# Patient Record
Sex: Male | Born: 1955 | Race: Black or African American | Hispanic: Refuse to answer | Marital: Married | Smoking: Never smoker
Health system: Southern US, Community
[De-identification: ages and names within clinical notes are randomized; demographics above are authoritative.]

## PROBLEM LIST (undated history)

## (undated) DIAGNOSIS — Z973 Presence of spectacles and contact lenses: Secondary | ICD-10-CM

## (undated) DIAGNOSIS — M549 Dorsalgia, unspecified: Secondary | ICD-10-CM

## (undated) DIAGNOSIS — C61 Malignant neoplasm of prostate: Secondary | ICD-10-CM

## (undated) DIAGNOSIS — Z972 Presence of dental prosthetic device (complete) (partial): Secondary | ICD-10-CM

## (undated) DIAGNOSIS — I10 Essential (primary) hypertension: Secondary | ICD-10-CM

## (undated) DIAGNOSIS — R42 Dizziness and giddiness: Secondary | ICD-10-CM

## (undated) HISTORY — PX: CARPAL TUNNEL RELEASE: SHX101

## (undated) HISTORY — PX: PROSTATE BIOPSY: SHX241

## (undated) HISTORY — PX: WISDOM TOOTH EXTRACTION: SHX21

## (undated) HISTORY — PX: HIP ARTHROPLASTY: SHX981

---

## 2013-10-29 ENCOUNTER — Encounter (HOSPITAL_COMMUNITY): Payer: Self-pay | Admitting: Emergency Medicine

## 2013-10-29 ENCOUNTER — Emergency Department (INDEPENDENT_AMBULATORY_CARE_PROVIDER_SITE_OTHER)
Admission: EM | Admit: 2013-10-29 | Discharge: 2013-10-29 | Disposition: A | Discharge status: Home / Self Care | Payer: Medicare Other | Source: Home / Self Care | Attending: Family Medicine | Admitting: Family Medicine

## 2013-10-29 DIAGNOSIS — M169 Osteoarthritis of hip, unspecified: Secondary | ICD-10-CM

## 2013-10-29 DIAGNOSIS — M1610 Unilateral primary osteoarthritis, unspecified hip: Secondary | ICD-10-CM

## 2013-10-29 HISTORY — DX: Dorsalgia, unspecified: M54.9

## 2013-10-29 NOTE — ED Notes (Signed)
Patient requesting note to be excused from jury duty.  Patient reports disability issues.  Patient does not have a pcp

## 2013-10-29 NOTE — Discharge Instructions (Signed)
Thank you for coming in today. Please followup with her regular doctor to talk about your disability and your blood pressure. Please consider hip replacement. Call or go to the emergency room if you get worse, have trouble breathing, have chest pains, or palpitations.

## 2013-10-29 NOTE — ED Provider Notes (Signed)
Ellwood Steidle is a 58 y.o. male who presents to Urgent Care today for chronic hip pain. Patient has chronic hip and low back pain secondary to DJD and DDD. He would like any excuse from jury duty form filled out if possible. He also has some questions about hip replacement. He stays active and cycles most is a week and participates in martial arts. He is unable to sit for more than an hour without significant pain. Some days he has considerable difficulty walking and uses a cane. He does not take any medications for his pain. He has been on Social Security disability now for 5 years.  He notes that his blood pressures usually in the 150s. He is trying lifestyle modification. No chest pains palpitations weakness or numbness   Past Medical History  Diagnosis Date  . Back pain    History  Substance Use Topics  . Smoking status: Never Smoker   . Smokeless tobacco: Not on file  . Alcohol Use: Yes   ROS as above Medications: No current facility-administered medications for this encounter.   No current outpatient prescriptions on file.    Exam:  BP 194/104  Pulse 71  Temp(Src) 98.1 F (36.7 C) (Oral)  Resp 18  SpO2 100% Gen: Well NAD HEENT: EOMI,  MMM Lungs: Normal work of breathing. CTABL Heart: RRR no MRG Abd: NABS, Soft. NT, ND Exts: Brisk capillary refill, warm and well perfused.  Hips bilaterally:  Profound lack of rotation range of motion with pain. Flexion and extension are also decreased. Antalgic gait. Patient uses a cane.    Assessment and Plan: 58 y.o. male with bilateral severe hip DJD. Jury excuse in temporary handicap tag provided.  Refer to [place] for surgical consultation.   Encourage patient to consider taking medications for blood pressure. Followup with primary care provider  Discussed warning signs or symptoms. Please see discharge instructions. Patient expresses understanding.    Gregor Hams, MD 10/29/13 [phone]

## 2017-02-28 DIAGNOSIS — Z91148 Patient's other noncompliance with medication regimen for other reason: Secondary | ICD-10-CM | POA: Insufficient documentation

## 2017-02-28 DIAGNOSIS — I10 Essential (primary) hypertension: Secondary | ICD-10-CM | POA: Insufficient documentation

## 2017-02-28 DIAGNOSIS — Z9114 Patient's other noncompliance with medication regimen: Secondary | ICD-10-CM | POA: Insufficient documentation

## 2017-08-21 DIAGNOSIS — M25552 Pain in left hip: Secondary | ICD-10-CM | POA: Insufficient documentation

## 2017-08-21 DIAGNOSIS — M25551 Pain in right hip: Secondary | ICD-10-CM | POA: Insufficient documentation

## 2018-05-22 DIAGNOSIS — R972 Elevated prostate specific antigen [PSA]: Secondary | ICD-10-CM | POA: Insufficient documentation

## 2018-09-18 HISTORY — PX: HIP ARTHROPLASTY: SHX981

## 2018-09-18 HISTORY — PX: OTHER SURGICAL HISTORY: SHX169

## 2018-10-09 DIAGNOSIS — Z96649 Presence of unspecified artificial hip joint: Secondary | ICD-10-CM | POA: Insufficient documentation

## 2019-06-02 DIAGNOSIS — G5602 Carpal tunnel syndrome, left upper limb: Secondary | ICD-10-CM | POA: Insufficient documentation

## 2019-09-19 HISTORY — PX: CARPAL TUNNEL RELEASE: SHX101

## 2019-12-02 DIAGNOSIS — E782 Mixed hyperlipidemia: Secondary | ICD-10-CM | POA: Insufficient documentation

## 2020-06-03 DIAGNOSIS — N521 Erectile dysfunction due to diseases classified elsewhere: Secondary | ICD-10-CM | POA: Insufficient documentation

## 2020-06-03 DIAGNOSIS — R202 Paresthesia of skin: Secondary | ICD-10-CM | POA: Insufficient documentation

## 2020-06-03 DIAGNOSIS — R200 Anesthesia of skin: Secondary | ICD-10-CM | POA: Insufficient documentation

## 2021-04-15 ENCOUNTER — Other Ambulatory Visit: Payer: Self-pay | Admitting: Urology

## 2021-04-15 DIAGNOSIS — R972 Elevated prostate specific antigen [PSA]: Secondary | ICD-10-CM

## 2021-05-12 ENCOUNTER — Other Ambulatory Visit: Payer: Self-pay

## 2021-05-12 ENCOUNTER — Ambulatory Visit
Admission: RE | Admit: 2021-05-12 | Discharge: 2021-05-12 | Disposition: A | Discharge status: Home / Self Care | Payer: Medicare Other | Source: Ambulatory Visit | Attending: Urology | Admitting: Urology

## 2021-05-12 DIAGNOSIS — R972 Elevated prostate specific antigen [PSA]: Secondary | ICD-10-CM

## 2021-07-21 DIAGNOSIS — I517 Cardiomegaly: Secondary | ICD-10-CM | POA: Insufficient documentation

## 2021-08-17 ENCOUNTER — Other Ambulatory Visit (HOSPITAL_COMMUNITY): Payer: Self-pay | Admitting: Urology

## 2021-08-17 ENCOUNTER — Other Ambulatory Visit: Payer: Self-pay | Admitting: Urology

## 2021-08-17 DIAGNOSIS — R972 Elevated prostate specific antigen [PSA]: Secondary | ICD-10-CM

## 2021-08-19 NOTE — Progress Notes (Signed)
Sent message, via epic in basket, requesting orders in epic from surgeon.  

## 2021-08-23 NOTE — Progress Notes (Addendum)
COVID swab appointment: n/a  COVID Vaccine Completed: yes x5 Date COVID Vaccine completed: Has received booster: COVID vaccine manufacturer: Pfizer      Date of COVID positive in last 90 days: no  PCP - [place] Cardiologist - [facility]  Chest x-ray - n/a EKG - 07/07/21 req [facility] Stress Test - n/a ECHO - 07/19/21 Care Cardiac Cath - n/a Pacemaker/ICD device last checked: n/a Spinal Cord Stimulator: n/a  Sleep Study - n/a CPAP -   Fasting Blood Sugar - n/a Checks Blood Sugar _____ times a day  Blood Thinner Instructions: n/a Aspirin Instructions: Last Dose:  Activity level: Can go up a flight of stairs and perform activities of daily living without stopping and without symptoms of chest pain or shortness of breath.    Anesthesia review:   Patient denies shortness of breath, fever, cough and chest pain at PAT appointment   Patient verbalized understanding of instructions that were given to them at the PAT appointment. Patient was also instructed that they will need to review over the PAT instructions again at home before surgery.

## 2021-08-23 NOTE — Patient Instructions (Addendum)
DUE TO COVID-19 ONLY ONE VISITOR IS ALLOWED TO COME WITH YOU AND STAY IN THE WAITING ROOM ONLY DURING PRE OP AND PROCEDURE.   **NO VISITORS ARE ALLOWED IN THE SHORT STAY AREA OR RECOVERY ROOM!!**       Your procedure is scheduled on: 08/29/21   Report to [facility] Main Entrance    Report to admitting at 9:30 AM   Call this number if you have problems the morning of surgery [phone]   Do not eat food :After Midnight.   May have liquids until 8:40 AM day of surgery  CLEAR LIQUID DIET  Foods Allowed                                                                     Foods Excluded  Water, Black Coffee and tea (no milk or creamer)            liquids that you cannot  Plain Jell-O in any flavor  (No red)                                    see through such as: Fruit ices (not with fruit pulp)                                            milk, soups, orange juice              Iced Popsicles (No red)                                                 All solid food                                   Apple juices Sports drinks like Gatorade (No red) Lightly seasoned clear broth or consume(fat free) Sugar    Oral Hygiene is also important to reduce your risk of infection.                                    Remember - BRUSH YOUR TEETH THE MORNING OF SURGERY WITH YOUR REGULAR TOOTHPASTE   Take these medicines the morning of surgery with A SIP OF WATER: Amlodipine                               You may not have any metal on your body including jewelry, and body piercing             Do not wear lotions, powders, cologne, or deodorant              Men may shave face and neck.   Do not bring valuables to the hospital. [facility] IS NOT  RESPONSIBLE   FOR VALUABLES.    Patients discharged on the day of surgery will not be allowed to drive home.              Please read over the following fact sheets you were given: IF YOU HAVE QUESTIONS ABOUT YOUR PRE-OP INSTRUCTIONS  PLEASE CALL [facility] - Preparing for Surgery Before surgery, you can play an important role.  Because skin is not sterile, your skin needs to be as free of germs as possible.  You can reduce the number of germs on your skin by washing with CHG (chlorahexidine gluconate) soap before surgery.  CHG is an antiseptic cleaner which kills germs and bonds with the skin to continue killing germs even after washing. Please DO NOT use if you have an allergy to CHG or antibacterial soaps.  If your skin becomes reddened/irritated stop using the CHG and inform your nurse when you arrive at Short Stay. Do not shave (including legs and underarms) for at least 48 hours prior to the first CHG shower.  You may shave your face/neck.  Please follow these instructions carefully:  1.  Shower with CHG Soap the night before surgery and the  morning of surgery.  2.  If you choose to wash your hair, wash your hair first as usual with your normal  shampoo.  3.  After you shampoo, rinse your hair and body thoroughly to remove the shampoo.                             4.  Use CHG as you would any other liquid soap.  You can apply chg directly to the skin and wash.  Gently with a scrungie or clean washcloth.  5.  Apply the CHG Soap to your body ONLY FROM THE NECK DOWN.   Do   not use on face/ open                           Wound or open sores. Avoid contact with eyes, ears mouth and   genitals (private parts).                       Wash face,  Genitals (private parts) with your normal soap.             6.  Wash thoroughly, paying special attention to the area where your    surgery  will be performed.  7.  Thoroughly rinse your body with warm water from the neck down.  8.  DO NOT shower/wash with your normal soap after using and rinsing off the CHG Soap.                9.  Pat yourself dry with a clean towel.            10.  Wear clean pajamas.            11.  Place clean sheets on your bed the night of  your first shower and do not  sleep with pets. Day of Surgery : Do not apply any lotions/deodorants the morning of surgery.  Please wear clean clothes to the hospital/surgery center.  FAILURE TO FOLLOW THESE INSTRUCTIONS MAY RESULT IN THE CANCELLATION OF YOUR SURGERY  PATIENT SIGNATURE_________________________________  NURSE SIGNATURE__________________________________  ________________________________________________________________________

## 2021-08-24 ENCOUNTER — Encounter (HOSPITAL_COMMUNITY)
Admission: RE | Admit: 2021-08-24 | Discharge: 2021-08-24 | Disposition: A | Discharge status: Home / Self Care | Payer: Medicare Other | Source: Ambulatory Visit | Attending: Urology | Admitting: Urology

## 2021-08-24 ENCOUNTER — Encounter (HOSPITAL_COMMUNITY): Payer: Self-pay

## 2021-08-24 ENCOUNTER — Other Ambulatory Visit: Payer: Self-pay

## 2021-08-24 VITALS — BP 142/90 | HR 64 | Temp 98.4°F | Resp 12 | Ht 72.0 in | Wt 184.6 lb

## 2021-08-24 DIAGNOSIS — I2510 Atherosclerotic heart disease of native coronary artery without angina pectoris: Secondary | ICD-10-CM | POA: Insufficient documentation

## 2021-08-24 DIAGNOSIS — Z01812 Encounter for preprocedural laboratory examination: Secondary | ICD-10-CM | POA: Insufficient documentation

## 2021-08-24 HISTORY — DX: Essential (primary) hypertension: I10

## 2021-08-24 LAB — CBC
HCT: 43.4 % (ref 39.0–52.0)
Hemoglobin: 14.7 g/dL (ref 13.0–17.0)
MCH: 28.8 pg (ref 26.0–34.0)
MCHC: 33.9 g/dL (ref 30.0–36.0)
MCV: 85.1 fL (ref 80.0–100.0)
Platelets: 293 10*3/uL (ref 150–400)
RBC: 5.1 MIL/uL (ref 4.22–5.81)
RDW: 14.6 % (ref 11.5–15.5)
WBC: 7.4 10*3/uL (ref 4.0–10.5)
nRBC: 0 % (ref 0.0–0.2)

## 2021-08-24 LAB — BASIC METABOLIC PANEL
Anion gap: 8 (ref 5–15)
BUN: 21 mg/dL (ref 8–23)
CO2: 26 mmol/L (ref 22–32)
Calcium: 10.1 mg/dL (ref 8.9–10.3)
Chloride: 108 mmol/L (ref 98–111)
Creatinine, Ser: 1.25 mg/dL — ABNORMAL HIGH (ref 0.61–1.24)
GFR, Estimated: >60 mL/min (ref >60)
Glucose, Bld: 84 mg/dL (ref 70–99)
Potassium: 3.9 mmol/L (ref 3.5–5.1)
Sodium: 142 mmol/L (ref 135–145)

## 2021-08-29 ENCOUNTER — Encounter (HOSPITAL_COMMUNITY): Payer: Self-pay | Admitting: Urology

## 2021-08-29 ENCOUNTER — Ambulatory Visit (HOSPITAL_COMMUNITY)
Admission: RE | Admit: 2021-08-29 | Discharge: 2021-08-29 | Disposition: A | Discharge status: Home / Self Care | Payer: Medicare Other | Attending: Urology | Admitting: Urology

## 2021-08-29 ENCOUNTER — Ambulatory Visit (HOSPITAL_COMMUNITY)
Admission: RE | Admit: 2021-08-29 | Discharge: 2021-08-29 | Disposition: A | Discharge status: Home / Self Care | Payer: Medicare Other | Source: Ambulatory Visit | Attending: Urology | Admitting: Urology

## 2021-08-29 ENCOUNTER — Encounter (HOSPITAL_COMMUNITY)
Admission: RE | Disposition: A | Discharge status: Home / Self Care | Payer: Self-pay | Source: Home / Self Care | Attending: Urology

## 2021-08-29 ENCOUNTER — Ambulatory Visit (HOSPITAL_COMMUNITY): Payer: Medicare Other | Admitting: Anesthesiology

## 2021-08-29 DIAGNOSIS — I10 Essential (primary) hypertension: Secondary | ICD-10-CM | POA: Diagnosis not present

## 2021-08-29 DIAGNOSIS — C61 Malignant neoplasm of prostate: Secondary | ICD-10-CM | POA: Insufficient documentation

## 2021-08-29 DIAGNOSIS — R972 Elevated prostate specific antigen [PSA]: Secondary | ICD-10-CM

## 2021-08-29 DIAGNOSIS — N5201 Erectile dysfunction due to arterial insufficiency: Secondary | ICD-10-CM | POA: Diagnosis not present

## 2021-08-29 DIAGNOSIS — N400 Enlarged prostate without lower urinary tract symptoms: Secondary | ICD-10-CM | POA: Insufficient documentation

## 2021-08-29 HISTORY — PX: PROSTATE BIOPSY: SHX241

## 2021-08-29 SURGERY — BIOPSY, PROSTATE, RECTAL APPROACH, WITH US GUIDANCE
Anesthesia: General

## 2021-08-29 MED ORDER — PROPOFOL 10 MG/ML IV BOLUS
INTRAVENOUS | Status: AC
  Filled 2021-08-29: qty 20

## 2021-08-29 MED ORDER — PROPOFOL 10 MG/ML IV BOLUS
INTRAVENOUS | Status: DC | PRN
  Administered 2021-08-29: 200 mg via INTRAVENOUS
  Administered 2021-08-29: 50 mg via INTRAVENOUS

## 2021-08-29 MED ORDER — ONDANSETRON HCL 4 MG/2ML IJ SOLN
INTRAMUSCULAR | Status: AC
  Filled 2021-08-29: qty 2

## 2021-08-29 MED ORDER — CEFAZOLIN SODIUM-DEXTROSE 2-4 GM/100ML-% IV SOLN
2.0000 g | INTRAVENOUS | Status: AC
  Administered 2021-08-29: 2 g via INTRAVENOUS
  Filled 2021-08-29: qty 100

## 2021-08-29 MED ORDER — LACTATED RINGERS IV SOLN: INTRAVENOUS | Status: DC

## 2021-08-29 MED ORDER — OXYCODONE HCL 5 MG PO TABS: 5.0000 mg | ORAL_TABLET | Freq: Once | ORAL | Status: DC | PRN

## 2021-08-29 MED ORDER — MIDAZOLAM HCL 2 MG/2ML IJ SOLN
INTRAMUSCULAR | Status: AC
  Filled 2021-08-29: qty 2

## 2021-08-29 MED ORDER — ACETAMINOPHEN 500 MG PO TABS
1000.0000 mg | ORAL_TABLET | Freq: Once | ORAL | Status: AC
  Administered 2021-08-29: 1000 mg via ORAL
  Filled 2021-08-29: qty 2

## 2021-08-29 MED ORDER — MIDAZOLAM HCL 5 MG/5ML IJ SOLN
INTRAMUSCULAR | Status: DC | PRN
Start: 2021-08-29 — End: 2021-08-29

## 2021-08-29 MED ORDER — PHENYLEPHRINE 40 MCG/ML (10ML) SYRINGE FOR IV PUSH (FOR BLOOD PRESSURE SUPPORT)
PREFILLED_SYRINGE | INTRAVENOUS | Status: AC
  Filled 2021-08-29: qty 20

## 2021-08-29 MED ORDER — SUGAMMADEX SODIUM 500 MG/5ML IV SOLN
INTRAVENOUS | Status: AC
  Filled 2021-08-29: qty 15

## 2021-08-29 MED ORDER — ONDANSETRON HCL 4 MG/2ML IJ SOLN
INTRAMUSCULAR | Status: DC | PRN
  Administered 2021-08-29: 4 mg via INTRAVENOUS

## 2021-08-29 MED ORDER — OXYCODONE HCL 5 MG/5ML PO SOLN: 5.0000 mg | Freq: Once | ORAL | Status: DC | PRN

## 2021-08-29 MED ORDER — DEXAMETHASONE SODIUM PHOSPHATE 10 MG/ML IJ SOLN
INTRAMUSCULAR | Status: AC
  Filled 2021-08-29: qty 1

## 2021-08-29 MED ORDER — PHENYLEPHRINE 40 MCG/ML (10ML) SYRINGE FOR IV PUSH (FOR BLOOD PRESSURE SUPPORT)
PREFILLED_SYRINGE | INTRAVENOUS | Status: DC | PRN
  Administered 2021-08-29: 80 ug via INTRAVENOUS
  Administered 2021-08-29: 40 ug via INTRAVENOUS
  Administered 2021-08-29: 80 ug via INTRAVENOUS
  Administered 2021-08-29: 120 ug via INTRAVENOUS
  Administered 2021-08-29 (×3): 80 ug via INTRAVENOUS

## 2021-08-29 MED ORDER — LIDOCAINE HCL 2 % IJ SOLN
INTRAMUSCULAR | Status: AC
  Filled 2021-08-29: qty 20

## 2021-08-29 MED ORDER — LIDOCAINE 2% (20 MG/ML) 5 ML SYRINGE
INTRAMUSCULAR | Status: DC | PRN
  Administered 2021-08-29: 100 mg via INTRAVENOUS

## 2021-08-29 MED ORDER — FENTANYL CITRATE PF 50 MCG/ML IJ SOSY: 25.0000 ug | PREFILLED_SYRINGE | INTRAMUSCULAR | Status: DC | PRN

## 2021-08-29 MED ORDER — AMISULPRIDE (ANTIEMETIC) 5 MG/2ML IV SOLN: 10.0000 mg | Freq: Once | INTRAVENOUS | Status: DC | PRN

## 2021-08-29 MED ORDER — GLYCOPYRROLATE 0.2 MG/ML IJ SOLN
INTRAMUSCULAR | Status: AC
  Filled 2021-08-29: qty 1

## 2021-08-29 MED ORDER — ORAL CARE MOUTH RINSE: 15.0000 mL | Freq: Once | OROMUCOSAL | Status: AC

## 2021-08-29 MED ORDER — FENTANYL CITRATE (PF) 100 MCG/2ML IJ SOLN
INTRAMUSCULAR | Status: AC
  Filled 2021-08-29: qty 2

## 2021-08-29 MED ORDER — GENTAMICIN SULFATE 40 MG/ML IJ SOLN
5.0000 mg/kg | INTRAVENOUS | Status: AC
  Administered 2021-08-29: 420 mg via INTRAVENOUS
  Filled 2021-08-29: qty 10.5

## 2021-08-29 MED ORDER — DEXAMETHASONE SODIUM PHOSPHATE 10 MG/ML IJ SOLN
INTRAMUSCULAR | Status: DC | PRN
  Administered 2021-08-29: 10 mg via INTRAVENOUS

## 2021-08-29 MED ORDER — CHLORHEXIDINE GLUCONATE 0.12 % MT SOLN
15.0000 mL | Freq: Once | OROMUCOSAL | Status: AC
  Administered 2021-08-29: 15 mL via OROMUCOSAL

## 2021-08-29 MED ORDER — ONDANSETRON HCL 4 MG/2ML IJ SOLN: 4.0000 mg | Freq: Once | INTRAMUSCULAR | Status: DC | PRN

## 2021-08-29 MED ORDER — FENTANYL CITRATE (PF) 100 MCG/2ML IJ SOLN
INTRAMUSCULAR | Status: DC | PRN
  Administered 2021-08-29: 50 ug via INTRAVENOUS

## 2021-08-29 SURGICAL SUPPLY — 11 items
BAG COUNTER SPONGE SURGICOUNT (BAG) IMPLANT
COVER SURGICAL LIGHT HANDLE (MISCELLANEOUS) ×1 IMPLANT
DRSG TEGADERM 4X4.75 (GAUZE/BANDAGES/DRESSINGS) ×1 IMPLANT
DRSG TELFA 3X8 NADH (GAUZE/BANDAGES/DRESSINGS) ×2 IMPLANT
GAUZE SPONGE 4X4 12PLY STRL (GAUZE/BANDAGES/DRESSINGS) ×1 IMPLANT
INST BIOPSY MAXCORE 18GX25 (NEEDLE) ×1 IMPLANT
INSTR BIOPSY MAXCORE 18GX20 (NEEDLE) IMPLANT
PAD DRESSING TELFA 3X8 NADH (GAUZE/BANDAGES/DRESSINGS) IMPLANT
PENCIL SMOKE EVACUATOR (MISCELLANEOUS) IMPLANT
SYR CONTROL 10ML LL (SYRINGE) IMPLANT
UNDERPAD 30X36 HEAVY ABSORB (UNDERPADS AND DIAPERS) ×2 IMPLANT

## 2021-08-29 NOTE — Transfer of Care (Signed)
Immediate Anesthesia Transfer of Care Note  Patient: Ethan Morton  Procedure(s) Performed: Procedure(s): BIOPSY TRANSRECTAL ULTRASONIC PROSTATE (TUBP) (N/A)  Patient Location: PACU  Anesthesia Type:General  Level of Consciousness: Alert, Awake, Oriented  Airway & Oxygen Therapy: Patient Spontanous Breathing  Post-op Assessment: Report given to RN  Post vital signs: Reviewed and stable  Last Vitals:  Vitals:   08/29/21 0940  BP: 128/85  Pulse: (!) 51  Resp: 15  Temp: 36.8 C  SpO2: 409%    Complications: No apparent anesthesia complications

## 2021-08-29 NOTE — Anesthesia Preprocedure Evaluation (Addendum)
Anesthesia Evaluation  Patient identified by MRN, date of birth, ID band Patient awake    Reviewed: Allergy & Precautions, NPO status , Patient's Chart, lab work & pertinent test results  Airway Mallampati: III  TM Distance: >3 FB Neck ROM: Full    Dental  (+) Edentulous Lower, Edentulous Upper,  2 teeth present, loose:   Pulmonary neg pulmonary ROS,    Pulmonary exam normal breath sounds clear to auscultation       Cardiovascular Exercise Tolerance: Good hypertension, Pt. on medications Normal cardiovascular exam Rhythm:Regular Rate:Normal     Neuro/Psych negative neurological ROS  negative psych ROS   GI/Hepatic negative GI ROS, (+)     substance abuse  marijuana use,   Endo/Other  negative endocrine ROS  Renal/GU negative Renal ROS  negative genitourinary   Musculoskeletal negative musculoskeletal ROS (+)   Abdominal   Peds negative pediatric ROS (+)  Hematology negative hematology ROS (+)   Anesthesia Other Findings   Reproductive/Obstetrics negative OB ROS                           Anesthesia Physical Anesthesia Plan  ASA: 2  Anesthesia Plan: General   Post-op Pain Management:    Induction:   PONV Risk Score and Plan: 2 and Treatment may vary due to age or medical condition  Airway Management Planned: LMA  Additional Equipment: None  Intra-op Plan:   Post-operative Plan: Extubation in OR  Informed Consent: I have reviewed the patients History and Physical, chart, labs and discussed the procedure including the risks, benefits and alternatives for the proposed anesthesia with the patient or authorized representative who has indicated his/her understanding and acceptance.     Dental advisory given  Plan Discussed with: CRNA, Surgeon and Anesthesiologist  Anesthesia Plan Comments:        Anesthesia Quick Evaluation

## 2021-08-29 NOTE — Anesthesia Procedure Notes (Signed)
Procedure Name: LMA Insertion Date/Time: 08/29/2021 12:51 PM Performed by: Gerald Leitz, CRNA Pre-anesthesia Checklist: Patient identified, Patient being monitored, Timeout performed, Emergency Drugs available and Suction available Patient Re-evaluated:Patient Re-evaluated prior to induction Oxygen Delivery Method: Circle system utilized Preoxygenation: Pre-oxygenation with 100% oxygen Induction Type: IV induction Ventilation: Mask ventilation without difficulty LMA: LMA inserted LMA Size: 4.0 Tube type: Oral Number of attempts: 1 Placement Confirmation: positive ETCO2 and breath sounds checked- equal and bilateral Tube secured with: Tape Dental Injury: Teeth and Oropharynx as per pre-operative assessment  Comments: Dentition noted in pre-assessment; Bottom left lower tooth loose and bleed upon LMA insertion-remains intact. MDA Bass at bedside to assess. Patient coughing against LMA after placement; deepened anesthetic and patient VSS; Proceeding with case.

## 2021-08-29 NOTE — Anesthesia Postprocedure Evaluation (Signed)
Anesthesia Post Note  Patient: Dmarion Perfect  Procedure(s) Performed: BIOPSY TRANSRECTAL ULTRASONIC PROSTATE (TUBP)     Patient location during evaluation: PACU Anesthesia Type: General Level of consciousness: awake Pain management: pain level controlled Vital Signs Assessment: post-procedure vital signs reviewed and stable Respiratory status: spontaneous breathing and respiratory function stable Cardiovascular status: stable Postop Assessment: no apparent nausea or vomiting Anesthetic complications: no   No notable events documented.  Last Vitals:  Vitals:   08/29/21 1345 08/29/21 1400  BP: (!) 143/96 (!) 150/81  Pulse: 67 63  Resp: 17 20  Temp: 36.7 C 36.7 C  SpO2: 98% 100%    Last Pain:  Vitals:   08/29/21 1400  TempSrc: Oral  PainSc: 0-No pain                 Merlinda Frederick

## 2021-08-29 NOTE — H&P (Signed)
CC: AUA Questions Scoring.  HPI: Ethan Morton is a 65 year-old male established patient who is here AUA Questions.      CC/HPI: CC: Elevated PSA, history of atypical glands.  HPI:  10/22/2019  65 year old male comes in for a 2nd opinion. Prior urologist was Ihor Dow. Patient has a history of elevated PSA. PSA values are as follows   04/01/2020: 5.8  07/31/2018: 4.7  06/02/1999 20:5.5   Patient underwent an initial prostate biopsy on 05/14/2018. This revealed a small focus of atypical glands. For this reason, he underwent a repeat biopsy on 07/31/2018. This again revealed a small focus of atypical glands. He had a 46 g prostate. He subsequently underwent an MRI of the prostate on 09/22/2019. This revealed a PIRADs 3 lesion in the right posterior peripheral zone. Prostate size was 39 mL. It was a 1.3 cm lesion. He did have a great deal pain with 1st 2 biopsies.   01/13/2020  Patient was recently scheduled for repeat MRI/ultrasound fusion guided biopsy. Our radiologist here as a 2nd opinion read the MRI and felt like his lesion represented a PIRADs 2 lesion felt to represent a benign lesion. Therefore, after discussion with the patient, he elected to undergo observation. Most recent PSA was up slightly and is now 6.54.   09/30/2020  Most recent PSA on 06/03/2020 was 6.9. This is relatively stable for him. He does not want to recheck it at this time. He is taking an over-the-counter supplement called noxitril for erectile dysfunction. I am not familiar with that. He was taking sildenafil, up to 100 mg, but he was not getting quite where he would like. No voiding complaints.   03/31/2021  Patient has not had an updated PSA. He has no complaints. He continues on Cialis and does well with this in regards to erectile dysfunction.   08/02/2021  Most recent PSA on 07/07/2021 was 10.1. He underwent MRI of the prostate that revealed a 61.5 g prostate. He was found to have a PI-RADS 3 lesion in  the right posterior peripheral zone centered in the mid gland extending towards the base. He states he is unable to undergo a clinic biopsy and request operating room. He understands that MRI fusion cannot be done in the operating room since they do not have the technology. I offered an MRI fusion biopsy in the office and he declined. He is also very interested in an inflatable penile prosthesis. He continues to use Cialis with mixed results. He is scared of needles and does not want to perform intracorporeal injection.     AUA Symptom Score: He never has the sensation of not emptying his bladder completely after finishing urinating. He never has to urinate again less that two hours after he has finished urinating. He does not have to stop and start again several times when he urinates. He never finds it difficult to postpone urination. He never has a weak urinary stream. He never has to push or strain to begin urination. He has to get up to urinate 1 time from the time he goes to bed until the time he gets up in the morning.   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 Has not smoked since 10/20/1979.   Tobacco Use Assessment Completed: Used Tobacco in last 30 days? Does not drink caffeine.    REVIEW  OF SYSTEMS:    GU Review Male:   Patient denies frequent urination, hard to postpone urination, burning/ pain with urination, get up at night to urinate, leakage of urine, stream starts and stops, trouble starting your stream, have to strain to urinate , erection problems, and penile pain.  Gastrointestinal (Upper):   Patient denies nausea, vomiting, and indigestion/ heartburn.  Gastrointestinal (Lower):   Patient denies diarrhea and constipation.  Constitutional:   Patient denies fever, night sweats, weight loss, and fatigue.  Skin:   Patient denies skin rash/ lesion and itching.  Eyes:   Patient denies blurred vision and double vision.  Ears/ Nose/ Throat:   Patient denies sore throat and sinus problems.  Hematologic/Lymphatic:   Patient denies swollen glands and easy bruising.  Cardiovascular:   Patient denies leg swelling and chest pains.  Respiratory:   Patient denies cough and shortness of breath.  Endocrine:   Patient denies excessive thirst.  Musculoskeletal:   Patient denies back pain and joint pain.  Neurological:   Patient denies headaches and dizziness.  Psychologic:   Patient denies depression and anxiety.   VITAL SIGNS: None   GU PHYSICAL EXAMINATION:    Testes: No tenderness, no swelling, no enlargement left testes. No tenderness, no swelling, no enlargement right testes. Normal location left testes. Normal location right testes. No mass, no cyst, no varicocele, no hydrocele left testes. No mass, no cyst, no varicocele, no hydrocele right testes.  Penis: Circumcised, no warts, no cracks. No dorsal Peyronie's plaques, no left corporal Peyronie's plaques, no right corporal Peyronie's plaques, no scarring, no warts. No balanitis, no meatal stenosis.   MULTI-SYSTEM PHYSICAL EXAMINATION:       Complexity of Data:  Source Of History:  Patient  Lab Test Review:   PSA  Records Review:   AUA Symptom Score, Previous Patient Records  Urine Test Review:   Urinalysis  X-Ray Review:  MRI Prostate GSORAD: Reviewed Films. Discussed With Patient. Performed at [facility]. 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 Risk and benefits discussed. I did offer him MRI fusion biopsy in the office but he declined and states he wants to have anesthesia for the procedure. In that case, I can perform extra biopsies in the targeted area of of the lesion.   The patient has elected to undergo placement of a three-piece inflatable penile prosthesis. He understands that this is an invasive surgical option for erectile dysfunction and will result in permanent inability to achieve natural erections. He understands the potential complications of the surgery including bleeding, pain, ST deformity, injury to surrounding structures such as urethral injury, erosion of the device, as well as the potentially devastating complication of infection that would require complete removal of the entire prosthesis. He also understands that the typical lifespan of a prosthetic is 5-10 years (but may last shorter or longer) and if it becomes nonfunctional then it will require replacement. He understands that the length of his erection will be approximately equal to  stretched penile length and that the most common complaint among men after this surgery is the appearance of a smaller erection. We discussed that there is an overall high satisfaction rate upwards of 80-90% among men who undergo the surgery. He is eager to proceed. He wants to proceed with prostate biopsy first and to give this priority.   CC: Dr. Daria Pastures      Signed by Link Snuffer, III, M.D. on 08/02/21 at 9:50 AM (EST

## 2021-08-29 NOTE — Op Note (Signed)
Operative Note  Preoperative diagnosis:  1.  Elevated PSA  Postoperative diagnosis: 1.  Elevated PSA  Procedure(s): 1.  Transrectal ultrasound-guided biopsy  Surgeon: Link Snuffer, MD  Assistants: None  Anesthesia: General  Complications: None immediate  EBL: Minimal  Specimens: 1.  4 biopsies from the area of the targeted lesion on the right mid to base peripheral prostate 2.  Right base lateral 3.  Right base medial 4.  Right mid lateral 5.  Right mid medial 6.  Right apex lateral 7.  Right apex medial 8.  Left base lateral 9.  Left base medial 10.  Left mid lateral 11.  Left mid medial 12.  Left apex lateral 13.  Left apex medial  Drains/Catheters: 1.  None  Intraoperative findings: There were no hypoechoic areas.  Prostate size was 36 g  Indication: 65 year old male with elevated PSA.  He underwent an MRI of the prostate that revealed a PI-RADS 3 lesion in the right peripheral mid prostate extending towards the base.  He declined targeted biopsy in the office with fusion software.  He elected to proceed with prostate biopsy in the operating room  Description of procedure:  The patient was identified and consent was obtained.  The patient was taken to the operating room and placed in the supine position.  The patient was placed under general anesthesia.  Perioperative antibiotics were administered.  The patient was placed in left lateral position.  Patient was prepped and draped in a standard sterile fashion and a timeout was performed.  Transrectal ultrasound was inserted into the rectum.  Ultrasound images were obtained.  Sequential biopsies were then performed collecting the specimens as above.  This concluded the operation.  Patient tolerated the procedure well was stable postoperative.  Plan: Follow-up in about 1 week for pathology review

## 2021-08-30 ENCOUNTER — Encounter (HOSPITAL_COMMUNITY): Payer: Self-pay | Admitting: Urology

## 2021-08-30 LAB — SURGICAL PATHOLOGY

## 2021-09-14 ENCOUNTER — Telehealth: Payer: Self-pay | Admitting: Radiation Oncology

## 2021-09-30 ENCOUNTER — Other Ambulatory Visit (HOSPITAL_COMMUNITY): Payer: Self-pay | Admitting: Urology

## 2021-09-30 DIAGNOSIS — C61 Malignant neoplasm of prostate: Secondary | ICD-10-CM

## 2021-10-05 ENCOUNTER — Encounter (HOSPITAL_COMMUNITY)
Admission: RE | Admit: 2021-10-05 | Discharge: 2021-10-05 | Disposition: A | Discharge status: Home / Self Care | Payer: Medicare Other | Source: Ambulatory Visit | Attending: Urology | Admitting: Urology

## 2021-10-05 DIAGNOSIS — C61 Malignant neoplasm of prostate: Secondary | ICD-10-CM | POA: Insufficient documentation

## 2021-10-12 NOTE — Progress Notes (Signed)
GU Location of Tumor / Histology: Prostate Ca  If Prostate Cancer, Gleason Score is (4 + 3) and PSA is (10.1)  Biopsies  Dr. Gloriann Loan   Past/Anticipated interventions by urology, if any:   Weight changes, if any: No  IPSS:  6 SHIM: 16  Bowel/Bladder complaints, if any:  No bowel or bladder issues patient seems to think BP meds is cauing urinary issues frequency.  Nausea/Vomiting, if any:  No  Pain issues, if any:  0/10  SAFETY ISSUES: Prior radiation?  No Pacemaker/ICD? No Possible current pregnancy?  Male Is the patient on methotrexate? No  Current Complaints / other details:  Need more information on treatment options.

## 2021-10-14 ENCOUNTER — Ambulatory Visit (HOSPITAL_COMMUNITY)
Admission: RE | Admit: 2021-10-14 | Discharge: 2021-10-14 | Disposition: A | Discharge status: Home / Self Care | Payer: Medicare Other | Source: Ambulatory Visit | Attending: Urology | Admitting: Urology

## 2021-10-14 ENCOUNTER — Other Ambulatory Visit: Payer: Self-pay

## 2021-10-14 DIAGNOSIS — C61 Malignant neoplasm of prostate: Secondary | ICD-10-CM | POA: Diagnosis not present

## 2021-10-14 MED ORDER — PIFLIFOLASTAT F 18 (PYLARIFY) INJECTION
9.0000 | Freq: Once | INTRAVENOUS | Status: AC
  Administered 2021-10-14: 8.8 via INTRAVENOUS

## 2021-10-18 ENCOUNTER — Ambulatory Visit
Admission: RE | Admit: 2021-10-18 | Discharge: 2021-10-18 | Disposition: A | Discharge status: Home / Self Care | Payer: Medicare Other | Source: Ambulatory Visit | Attending: Radiation Oncology | Admitting: Radiation Oncology

## 2021-10-18 ENCOUNTER — Other Ambulatory Visit: Payer: Self-pay

## 2021-10-18 VITALS — BP 155/84 | HR 55 | Temp 96.4°F | Resp 18 | Ht 72.0 in | Wt 197.0 lb

## 2021-10-18 DIAGNOSIS — C61 Malignant neoplasm of prostate: Secondary | ICD-10-CM

## 2021-10-18 DIAGNOSIS — Z79899 Other long term (current) drug therapy: Secondary | ICD-10-CM | POA: Diagnosis not present

## 2021-10-18 DIAGNOSIS — M545 Low back pain, unspecified: Secondary | ICD-10-CM | POA: Diagnosis not present

## 2021-10-18 DIAGNOSIS — I10 Essential (primary) hypertension: Secondary | ICD-10-CM | POA: Insufficient documentation

## 2021-10-18 NOTE — Progress Notes (Signed)
Introduced myself to the patient, and his wife, as the prostate nurse navigator.  Patient was accepting assistance with transportation for upcoming radiation appointment, and I educated patient that we can provide and coordinate this once appointments are scheduled.  He is here to discuss his radiation treatment options.  I gave him my business card and asked him to call me with questions or concerns.  Verbalized understanding.  RN will continue to follow to ensure transportation needs are met.

## 2021-10-18 NOTE — Progress Notes (Signed)
Radiation Oncology         [phone]) [phone] ________________________________  Initial Outpatient Consultation  Name: Ethan Morton MRN: [id number]  Date: 10/18/2021  DOB: 14-Feb-1956  NG:[id number], Cherylann Ratel, PA-C  Lucas Mallow, MD   REFERRING PHYSICIAN: Lucas Mallow, MD  DIAGNOSIS: 66 y.o. gentleman with Stage T1c adenocarcinoma of the prostate with Gleason score of 4+3, and PSA of 10.1.    ICD-10-CM   1. Malignant neoplasm of prostate ([place])  C61       HISTORY OF PRESENT ILLNESS: Ethan Morton is a 66 y.o. male with a diagnosis of prostate cancer. He has been followed by urology for an elevated PSA since at least 2019, previously seen by Dr. Tommi Rumps. His PSA was 4.7 and prior biopsies on 05/14/18 and 07/31/18 showed only a small focus of atypical glands. Because of the pain he experienced with the first two biopsies, he has been reluctant to proceed with further biopsy but has continued to monitor the PSA closely. He has been followed by Dr. Gloriann Loan since 10/2019 and PSA has fluctuated. His PSA was 5.5 in 05/2019 so he had a prostate MRI at [facility] in 09/2019 that showed a PI-RADS 3 lesion, but the lesion was felt to be only category 2 on a second reading by [facility]. Thus, MRI fusion prostate biopsy was cancelled and the decision was to continue to monitor the PSA. The PSA was 6.54 in 12/2019, 5.8 in 03/2020 and 6.9 in 05/2020.  The PSA increased to 8.08 in 03/2021 and up to 10.1  on 07/07/2021. This prompted a repeat prostate MRI at [facility] on 07/26/21 showing no significant change in the PI-RADS 3 lesion in the right peripheral zone. He was referred back to Dr. Gloriann Loan for consideration of repeat biopsy. The patient declined in-office MRI fusion biopsy and preferred to proceed with transrectal ultrasound with 12 biopsies of the prostate in the OR under anesthesia on 08/29/21.  The prostate volume measured 36 cc.  Out of 16 core biopsies, 5 were positive.  The maximum Gleason score was 4+3,  and this was seen in the left base lateral (with perineural invasion). Additionally, Gleason 3+4 was seen in the left apex and one core from right mid peripheral (with PNI), and small foci of Gleason 3+3 in a second right mid peripheral core and the left mid.  A PSMA scan was performed on 10/14/21 for disease staging and showed significant streak artifact in pelvis and attenuation of radiotracer activity related to bilateral hip prosthetics but within this caveat, no focal activity in  the prostate and no suspicious lymph nodes or visceral or skeletal metastasis.  The patient reviewed the biopsy results with his urologist and he has kindly been referred today for discussion of potential radiation treatment options.   PREVIOUS RADIATION THERAPY: No  PAST MEDICAL HISTORY:  Past Medical History:  Diagnosis Date   Back pain    Hypertension       PAST SURGICAL HISTORY: Past Surgical History:  Procedure Laterality Date   CARPAL TUNNEL RELEASE Left    HIP ARTHROPLASTY Bilateral    PROSTATE BIOPSY     PROSTATE BIOPSY N/A 08/29/2021   Procedure: BIOPSY TRANSRECTAL ULTRASONIC PROSTATE (TUBP);  Surgeon: Lucas Mallow, MD;  Location: [facility];  Service: Urology;  Laterality: N/A;   WISDOM TOOTH EXTRACTION      FAMILY HISTORY: No family history on file.  SOCIAL HISTORY:  Social History   Socioeconomic History   Marital  status: Married    Spouse name: Not on file   Number of children: Not on file   Years of education: Not on file   Highest education level: Not on file  Occupational History   Not on file  Tobacco Use   Smoking status: Never   Smokeless tobacco: Not on file  Vaping Use   Vaping Use: Never used  Substance and Sexual Activity   Alcohol use: Yes    Comment: once a month   Drug use: Yes    Frequency: 5.0 times per week    Types: Marijuana   Sexual activity: Not on file  Other Topics Concern   Not on file  Social History Narrative   Not on file   Social  Determinants of Health   Financial Resource Strain: Not on file  Food Insecurity: Not on file  Transportation Needs: Not on file  Physical Activity: Not on file  Stress: Not on file  Social Connections: Not on file  Intimate Partner Violence: Not on file    ALLERGIES: Patient has no known allergies.  MEDICATIONS:  Current Outpatient Medications  Medication Sig Dispense Refill   amLODipine (NORVASC) 10 MG tablet Take 10 mg by mouth daily.     Ascorbic Acid (VITAMIN C) 1000 MG tablet Take 1,000 mg by mouth daily.     Cholecalciferol (VITAMIN D-3) 125 MCG (5000 UT) TABS Take 5,000 Units by mouth daily.     losartan (COZAAR) 50 MG tablet Take 50 mg by mouth daily.     niacin 500 MG tablet Take 500 mg by mouth at bedtime.     OLIVE LEAF PO Take 750 mg by mouth daily.     Omega-3 Fatty Acids (FISH OIL PO) Take 840 mg by mouth daily.     tadalafil (CIALIS) 20 MG tablet Take 10 mg by mouth daily as needed for erectile dysfunction.     vitamin B-12 (CYANOCOBALAMIN) 1000 MCG tablet Take 1,000 mcg by mouth daily.     No current facility-administered medications for this encounter.    REVIEW OF SYSTEMS:  On review of systems, the patient reports that he is doing well overall. He denies any chest pain, shortness of breath, cough, fevers, chills, night sweats, unintended weight changes. He denies any bowel disturbances, and denies abdominal pain, nausea or vomiting. He denies any new musculoskeletal or joint aches or pains. His IPSS was 6, indicating mild urinary symptoms. His SHIM was 16, indicating he has moderate erectile dysfunction. A complete review of systems is obtained and is otherwise negative.    PHYSICAL EXAM:  Wt Readings from Last 3 Encounters:  10/18/21 197 lb (89.4 kg)  08/24/21 184 lb 9.6 oz (83.7 kg)   Temp Readings from Last 3 Encounters:  10/18/21 (!) 96.4 F (35.8 C) (Temporal)  08/29/21 98 F (36.7 C) (Oral)  08/24/21 98.4 F (36.9 C) (Oral)   BP Readings from  Last 3 Encounters:  10/18/21 (!) 155/84  08/29/21 (!) 150/81  08/24/21 (!) 142/90   Pulse Readings from Last 3 Encounters:  10/18/21 (!) 55  08/29/21 63  08/24/21 64   Pain Assessment Pain Score: 0-No pain/10  In general this is a well appearing African American male in no acute distress. He's alert and oriented x4 and appropriate throughout the examination. Cardiopulmonary assessment is negative for acute distress, and he exhibits normal effort.     KPS = 100  100 - Normal; no complaints; no evidence of disease. 90   - Able  to carry on normal activity; minor signs or symptoms of disease. 80   - Normal activity with effort; some signs or symptoms of disease. 60   - Cares for self; unable to carry on normal activity or to do active work. 60   - Requires occasional assistance, but is able to care for most of his personal needs. 50   - Requires considerable assistance and frequent medical care. 44   - Disabled; requires special care and assistance. 74   - Severely disabled; hospital admission is indicated although death not imminent. 11   - Very sick; hospital admission necessary; active supportive treatment necessary. 10   - Moribund; fatal processes progressing rapidly. 0     - Dead  Karnofsky DA, Abelmann Woodruff, Craver LS and Burchenal [facility] [phone]) The use of the nitrogen mustards in the palliative treatment of carcinoma: with particular reference to bronchogenic carcinoma Cancer 1 634-56  LABORATORY DATA:  Lab Results  Component Value Date   WBC 7.4 08/24/2021   HGB 14.7 08/24/2021   HCT 43.4 08/24/2021   MCV 85.1 08/24/2021   PLT 293 08/24/2021   Lab Results  Component Value Date   NA 142 08/24/2021   K 3.9 08/24/2021   CL 108 08/24/2021   CO2 26 08/24/2021   No results found for: ALT, AST, GGT, ALKPHOS, BILITOT   RADIOGRAPHY: NM PET (PSMA) SKULL TO MID THIGH  Result Date: 10/16/2021 CLINICAL DATA:  66 year old male with prostate carcinoma. Biopsy 08/29/2021. PSA 10.1.  prostate biopsy field Gleason 3+4=7 adenocarcinoma in the RIGHT mid gland, LEFT base, and LEFT apex. EXAM: NUCLEAR MEDICINE PET SKULL BASE TO THIGH TECHNIQUE: 10.1 mCi F18 Piflufolastat (Pylarify) was injected intravenously. Full-ring PET imaging was performed from the skull base to thigh after the radiotracer. CT data was obtained and used for attenuation correction and anatomic localization. COMPARISON:  None. FINDINGS: NECK No radiotracer activity in neck lymph nodes. Incidental CT finding: None CHEST No radiotracer accumulation within mediastinal or hilar lymph nodes. No suspicious pulmonary nodules on the CT scan. Incidental CT finding: None ABDOMEN/PELVIS Prostate: Significant streak artifact and radiotracer attenuation through the lower pelvis generated by the bilateral hip prosthetics. No focal activity in the prostate bed. Lymph nodes: No abnormal radiotracer accumulation within pelvic or abdominal nodes. There is significant streak artifact and attenuation of radiotracer activity within the pelvis. Liver: No evidence of liver metastasis Incidental CT finding: None SKELETON No focal  activity to suggest skeletal metastasis. IMPRESSION: 1. Significant streak artifact in the pelvis and attenuation of radiotracer activity related to the bilateral hip prosthetics. With this caveat, there is no focal activity in the prostate bed. No suspicious lymph nodes in the pelvis. 2. No periaortic retroperitoneal lymphadenopathy. 3. No visceral metastasis or skeletal metastasis. Electronically Signed   By: Suzy Bouchard M.D.   On: 10/16/2021 09:17      IMPRESSION/PLAN: 1. 66 y.o. gentleman with Stage T1c adenocarcinoma of the prostate with Gleason Score of 4+3, and PSA of 10.1. We discussed the patient's workup and outlined the nature of prostate cancer in this setting. The patient's T stage, Gleason's score, and PSA put him into the unfavorable intermediate risk group. Accordingly, he is eligible for a variety of  potential treatment options including brachytherapy, 5.5 weeks of external radiation, or prostatectomy. We discussed the available radiation techniques, and focused on the details and logistics of delivery. We discussed and outlined the risks, benefits, short and long-term effects associated with radiotherapy and compared and contrasted these with  prostatectomy. We discussed the role of SpaceOAR gel in reducing the rectal toxicity associated with radiotherapy. He was encouraged to ask questions that were answered to his stated satisfaction.  At the conclusion of our conversation, the patient is interested in moving forward with 5.5 weeks of external beam therapy. We will share our discussion with Dr. Gloriann Loan and make arrangements for fiducial markers and SpaceOAR gel placement, prior to CT simulation, to reduce rectal toxicity from radiotherapy. The patient appears to have a good understanding of his disease and our treatment recommendations which are of curative intent and is in agreement with the stated plan.  Therefore, we will move forward with treatment planning accordingly, in anticipation of beginning IMRT in the near future.  We personally spent 70 minutes in this encounter including chart review, reviewing radiological studies, meeting face-to-face with the patient, entering orders and completing documentation.    [name], PA-C    Tyler Pita, MD  [facility] Direct Dial: [phone]   Fax: [phone] [email or web address]   Skype   LinkedIn   This document serves as a record of services personally performed by Tyler Pita, MD and Freeman Caldron, PA-C. It was created on their behalf by Wilburn Mylar, a trained medical scribe. The creation of this record is based on the scribe's personal observations and the provider's statements to them. This document has been checked and approved by the attending provider.

## 2021-10-25 ENCOUNTER — Other Ambulatory Visit: Payer: Self-pay | Admitting: Urology

## 2021-10-31 ENCOUNTER — Telehealth: Payer: Self-pay | Admitting: *Deleted

## 2021-10-31 NOTE — Telephone Encounter (Signed)
CALLED PATIENT TO INFORM OF SIM DATE, LVM FOR A RETURN CALL

## 2021-11-04 NOTE — Progress Notes (Signed)
Reviewed pt medical history and each results from 07-19-2021 and [place] bilateral carotid 10-07-2021 and cardiology lov dr m badal with dr w fitzgerald mda and pt meets [facility] guidelines barring any acute status changes per dr w fitzgerald mda.

## 2021-12-06 ENCOUNTER — Encounter (HOSPITAL_BASED_OUTPATIENT_CLINIC_OR_DEPARTMENT_OTHER): Payer: Self-pay | Admitting: Urology

## 2021-12-07 ENCOUNTER — Other Ambulatory Visit: Payer: Self-pay

## 2021-12-07 ENCOUNTER — Encounter (HOSPITAL_BASED_OUTPATIENT_CLINIC_OR_DEPARTMENT_OTHER): Payer: Self-pay | Admitting: Urology

## 2021-12-07 NOTE — Anesthesia Preprocedure Evaluation (Addendum)
Anesthesia Evaluation  ?Patient identified by MRN, date of birth, ID band ?Patient awake ? ? ? ?Reviewed: ?Allergy & Precautions, NPO status , Patient's Chart, lab work & pertinent test results ? ?Airway ?Mallampati: II ? ?TM Distance: >3 FB ?Neck ROM: Full ? ? ? Dental ?no notable dental hx. ?(+) Upper Dentures, Lower Dentures,  ?  ?Pulmonary ? ?  ?Pulmonary exam normal ?breath sounds clear to auscultation ? ? ? ? ? ? Cardiovascular ?hypertension, Pt. on medications ?Normal cardiovascular exam ?Rhythm:Regular Rate:Normal ? ?12/22 EKG SB w LVH ?  ?Neuro/Psych ?  ? GI/Hepatic ?  ?Endo/Other  ?negative endocrine ROS ? Renal/GU ?Lab Results ?     Component                Value               Date                 ?     CREATININE               1.20                12/08/2021           ?     K                        3.7                 12/08/2021           ?      ? ?Prostate CA ? ?  ?Musculoskeletal ? ? Abdominal ?  ?Peds ? Hematology ?Lab Results ?     Component                Value               Date                     ?     HGB                      13.9                12/08/2021           ?     HCT                      41.0                12/08/2021           ?   ?   ?Anesthesia Other Findings ?NKA ? Reproductive/Obstetrics ? ?  ? ? ? ? ? ? ? ? ? ? ? ? ? ?  ?  ? ? ? ? ? ? ? ?Anesthesia Physical ?Anesthesia Plan ? ?ASA: 3 ? ?Anesthesia Plan: MAC  ? ?Post-op Pain Management:   ? ?Induction:  ? ?PONV Risk Score and Plan: Treatment may vary due to age or medical condition ? ?Airway Management Planned: Natural Airway and Nasal Cannula ? ?Additional Equipment: None ? ?Intra-op Plan:  ? ?Post-operative Plan:  ? ?Informed Consent: I have reviewed the patients History and Physical, chart, labs and discussed the procedure including the risks, benefits and alternatives for the proposed anesthesia with the patient or authorized representative who has indicated his/her understanding and acceptance.   ? ? ? ?Dental advisory  given ? ?Plan Discussed with: CRNA and Anesthesiologist ? ?Anesthesia Plan Comments:   ? ? ? ? ? ?Anesthesia Quick Evaluation ? ?

## 2021-12-07 NOTE — Progress Notes (Addendum)
Spoke w/ via phone for pre-op interview---pt ?Lab needs dos----Istat               ?Lab results------ekg 08-29-2021 chart/epic, cardiology lov dr Junius Roads badal 09-29-2021 chart/epic, echo 07-19-2021 [facility] chart/care everywhere, US carotid bilateral 11-03-2021 chart/care everywhere ?COVID test -----patient states asymptomatic no test needed ?Arrive at -------830 am 12-08-2021 ?NPO after MN NO Solid Food.  Clear liquids from MN until---730 am ?Med rec completed ?Medications to take morning of surgery -----Amlodipine ?Diabetic medication -----n/a ?Patient instructed no nail polish to be worn day of surgery ?Patient instructed to bring photo id and insurance card day of surgery ?Patient aware to have Driver (ride ) / caregiver  wife joy wife will stay  for 24 hours after surgery  ?Patient Special Instructions -----fleets enema hs night before surgery ?Pre-Op special Istructions -----none ?Patient verbalized understanding of instructions that were given at this phone interview. ?Patient denies shortness of breath, chest pain, fever, cough at this phone interview.  ?

## 2021-12-07 NOTE — H&P (Signed)
H&P ? ?Chief Complaint: Prostate cancer ? ?History of Present Illness: 66 yo male w/ GG 3 PCa presents for placement of fiducial markers as well as SpaceOAR in advance of EBRT. ? ?Past Medical History:  ?Diagnosis Date  ? Dizzy spells   ? worked up by cardiology 09-30-2021 echo done ef 50 to 55 %  ? Hypertension   ? Prostate cancer ([place])   ? Wears dentures upper   ? Wears glasses   ? Wears partial denture lower   ? ? ?Past Surgical History:  ?Procedure Laterality Date  ? CARPAL TUNNEL RELEASE Left 2021  ? colonscopy  2020  ? HIP ARTHROPLASTY Bilateral 2020  ? PROSTATE BIOPSY    ? PROSTATE BIOPSY N/A 08/29/2021  ? Procedure: BIOPSY TRANSRECTAL ULTRASONIC PROSTATE (TUBP);  Surgeon: Lucas Mallow, MD;  Location: [facility];  Service: Urology;  Laterality: N/A;  ? WISDOM TOOTH EXTRACTION    ? yrs ago  ? ? ?Home Medications:  ?Allergies as of 12/07/2021   ?No Known Allergies ?  ? ?  ?Medication List  ?  ? ? Notice   ?Cannot display discharge medications because the patient has not yet been admitted. ?  ? ? ?Allergies: No Known Allergies ? ?History reviewed. No pertinent family history. ? ?Social History:  reports that he has never smoked. He has never used smokeless tobacco. He reports current alcohol use. He reports current drug use. Frequency: 5.00 times per week. Drug: Marijuana. ? ?ROS: ?A complete review of systems was performed.  All systems are negative except for pertinent findings as noted. ? ?Physical Exam:  ?Vital signs in last 24 hours: ?Ht 6' (1.829 m)   Wt 87.1 kg   BMI 26.04 kg/m?  ?Constitutional:  Alert and oriented, No acute distress ?Cardiovascular: Regular rate  ?Respiratory: Normal respiratory effort ?GI: Abdomen is soft, nontender, nondistended, no abdominal masses. No CVAT.  ?Genitourinary: Normal male phallus, testes are descended bilaterally and non-tender and without masses, scrotum is normal in appearance without lesions or masses, perineum is normal on inspection. ?Lymphatic: No  lymphadenopathy ?Neurologic: Grossly intact, no focal deficits ?Psychiatric: Normal mood and affect ? ?I have reviewed prior pt notes ? ?I have reviewed notes from referring/previous physicians ? ?I have reviewed urinalysis results ? ?I have independently reviewed prior imaging ? ?I have reviewed prior PSA/path results ? ? ? ?Impression/Assessment:  ?Unfavorable risk PCA ? ?Plan:  ?Placement of SpaceOAR/fiducial markers ? ?

## 2021-12-08 ENCOUNTER — Ambulatory Visit (HOSPITAL_BASED_OUTPATIENT_CLINIC_OR_DEPARTMENT_OTHER)
Admission: RE | Admit: 2021-12-08 | Discharge: 2021-12-08 | Disposition: A | Discharge status: Home / Self Care | Payer: Medicare Other | Attending: Urology | Admitting: Urology

## 2021-12-08 ENCOUNTER — Ambulatory Visit (HOSPITAL_BASED_OUTPATIENT_CLINIC_OR_DEPARTMENT_OTHER): Payer: Medicare Other | Admitting: Anesthesiology

## 2021-12-08 ENCOUNTER — Encounter (HOSPITAL_BASED_OUTPATIENT_CLINIC_OR_DEPARTMENT_OTHER): Payer: Self-pay | Admitting: Urology

## 2021-12-08 ENCOUNTER — Encounter (HOSPITAL_BASED_OUTPATIENT_CLINIC_OR_DEPARTMENT_OTHER)
Admission: RE | Disposition: A | Discharge status: Home / Self Care | Payer: Self-pay | Source: Home / Self Care | Attending: Urology

## 2021-12-08 DIAGNOSIS — F1290 Cannabis use, unspecified, uncomplicated: Secondary | ICD-10-CM | POA: Diagnosis not present

## 2021-12-08 DIAGNOSIS — I10 Essential (primary) hypertension: Secondary | ICD-10-CM | POA: Diagnosis not present

## 2021-12-08 DIAGNOSIS — C61 Malignant neoplasm of prostate: Secondary | ICD-10-CM | POA: Insufficient documentation

## 2021-12-08 DIAGNOSIS — Z408 Encounter for other prophylactic surgery: Secondary | ICD-10-CM | POA: Diagnosis not present

## 2021-12-08 HISTORY — PX: SPACE OAR INSTILLATION: SHX6769

## 2021-12-08 HISTORY — DX: Malignant neoplasm of prostate: C61

## 2021-12-08 HISTORY — DX: Presence of spectacles and contact lenses: Z97.3

## 2021-12-08 HISTORY — DX: Dizziness and giddiness: R42

## 2021-12-08 HISTORY — DX: Presence of dental prosthetic device (complete) (partial): Z97.2

## 2021-12-08 HISTORY — PX: GOLD SEED IMPLANT: SHX6343

## 2021-12-08 LAB — POCT I-STAT, CHEM 8
BUN: 19 mg/dL (ref 8–23)
Calcium, Ion: 1.22 mmol/L (ref 1.15–1.40)
Chloride: 105 mmol/L (ref 98–111)
Creatinine, Ser: 1.2 mg/dL (ref 0.61–1.24)
Glucose, Bld: 94 mg/dL (ref 70–99)
HCT: 41 % (ref 39.0–52.0)
Hemoglobin: 13.9 g/dL (ref 13.0–17.0)
Potassium: 3.7 mmol/L (ref 3.5–5.1)
Sodium: 142 mmol/L (ref 135–145)
TCO2: 25 mmol/L (ref 22–32)

## 2021-12-08 SURGERY — INSERTION, GOLD SEEDS
Anesthesia: Monitor Anesthesia Care | Site: Prostate

## 2021-12-08 MED ORDER — PROPOFOL 1000 MG/100ML IV EMUL
INTRAVENOUS | Status: AC
  Filled 2021-12-08: qty 100

## 2021-12-08 MED ORDER — MIDAZOLAM HCL 5 MG/5ML IJ SOLN
INTRAMUSCULAR | Status: DC | PRN
  Administered 2021-12-08: 1 mg via INTRAVENOUS

## 2021-12-08 MED ORDER — DEXMEDETOMIDINE (PRECEDEX) IN NS 20 MCG/5ML (4 MCG/ML) IV SYRINGE
PREFILLED_SYRINGE | INTRAVENOUS | Status: DC | PRN
  Administered 2021-12-08: 2 ug via INTRAVENOUS
  Administered 2021-12-08: 4 ug via INTRAVENOUS

## 2021-12-08 MED ORDER — SODIUM CHLORIDE (PF) 0.9 % IJ SOLN
INTRAMUSCULAR | Status: DC | PRN
  Administered 2021-12-08: 10 mL via INTRAVENOUS

## 2021-12-08 MED ORDER — FENTANYL CITRATE (PF) 100 MCG/2ML IJ SOLN: 25.0000 ug | INTRAMUSCULAR | Status: DC | PRN

## 2021-12-08 MED ORDER — ONDANSETRON HCL 4 MG/2ML IJ SOLN: 4.0000 mg | Freq: Once | INTRAMUSCULAR | Status: DC | PRN

## 2021-12-08 MED ORDER — PROPOFOL 500 MG/50ML IV EMUL
INTRAVENOUS | Status: DC | PRN
Start: 2021-12-08 — End: 2021-12-08
  Administered 2021-12-08: 125 ug/kg/min via INTRAVENOUS

## 2021-12-08 MED ORDER — CEFAZOLIN SODIUM-DEXTROSE 2-4 GM/100ML-% IV SOLN
INTRAVENOUS | Status: AC
  Filled 2021-12-08: qty 100

## 2021-12-08 MED ORDER — LACTATED RINGERS IV SOLN: INTRAVENOUS | Status: DC

## 2021-12-08 MED ORDER — MIDAZOLAM HCL 2 MG/2ML IJ SOLN
INTRAMUSCULAR | Status: AC
  Filled 2021-12-08: qty 2

## 2021-12-08 MED ORDER — CEFAZOLIN SODIUM-DEXTROSE 2-4 GM/100ML-% IV SOLN
2.0000 g | INTRAVENOUS | Status: AC
  Administered 2021-12-08: 2 g via INTRAVENOUS

## 2021-12-08 MED ORDER — LIDOCAINE 2% (20 MG/ML) 5 ML SYRINGE
INTRAMUSCULAR | Status: DC | PRN
  Administered 2021-12-08: 100 mg via INTRAVENOUS

## 2021-12-08 MED ORDER — DEXMEDETOMIDINE (PRECEDEX) IN NS 20 MCG/5ML (4 MCG/ML) IV SYRINGE
PREFILLED_SYRINGE | INTRAVENOUS | Status: AC
  Filled 2021-12-08: qty 5

## 2021-12-08 MED ORDER — LIDOCAINE HCL 2 % IJ SOLN
INTRAMUSCULAR | Status: DC | PRN
  Administered 2021-12-08: 10 mL

## 2021-12-08 MED ORDER — PROPOFOL 10 MG/ML IV BOLUS
INTRAVENOUS | Status: DC | PRN
  Administered 2021-12-08: 10 mg via INTRAVENOUS
  Administered 2021-12-08: 20 mg via INTRAVENOUS

## 2021-12-08 MED ORDER — FLEET ENEMA 7-19 GM/118ML RE ENEM: 1.0000 | ENEMA | Freq: Once | RECTAL | Status: DC

## 2021-12-08 MED ORDER — LIDOCAINE HCL (PF) 2 % IJ SOLN
INTRAMUSCULAR | Status: AC
  Filled 2021-12-08: qty 5

## 2021-12-08 MED ORDER — ACETAMINOPHEN 10 MG/ML IV SOLN: 1000.0000 mg | Freq: Once | INTRAVENOUS | Status: DC | PRN

## 2021-12-08 SURGICAL SUPPLY — 24 items
BLADE CLIPPER SENSICLIP SURGIC (BLADE) ×2 IMPLANT
CNTNR URN SCR LID CUP LEK RST (MISCELLANEOUS) ×1 IMPLANT
CONT SPEC 4OZ STRL OR WHT (MISCELLANEOUS) ×2
COVER BACK TABLE 60X90IN (DRAPES) ×2 IMPLANT
DRSG IV TEGADERM 3.5X4.5 STRL (GAUZE/BANDAGES/DRESSINGS) ×1 IMPLANT
DRSG TEGADERM 4X4.75 (GAUZE/BANDAGES/DRESSINGS) ×2 IMPLANT
DRSG TEGADERM 8X12 (GAUZE/BANDAGES/DRESSINGS) ×2 IMPLANT
GAUZE SPONGE 4X4 12PLY STRL (GAUZE/BANDAGES/DRESSINGS) ×2 IMPLANT
GLOVE SURG ENC MOIS LTX SZ8 (GLOVE) ×2 IMPLANT
IMPL SPACEOAR VUE SYSTEM (Spacer) ×1 IMPLANT
IMPLANT SPACEOAR VUE SYSTEM (Spacer) ×2 IMPLANT
KIT TURNOVER CYSTO (KITS) ×2 IMPLANT
MARKER GOLD PRELOAD 1.2X3 (Urological Implant) ×3 IMPLANT
MARKER SKIN DUAL TIP RULER LAB (MISCELLANEOUS) ×2 IMPLANT
NDL SPNL 22GX7 QUINCKE BK (NEEDLE) ×1 IMPLANT
NEEDLE SPNL 22GX7 QUINCKE BK (NEEDLE) ×2 IMPLANT
SEED GOLD PRELOAD 1.2X3 (Urological Implant) ×2 IMPLANT
SHEATH ULTRASOUND LF (SHEATH) IMPLANT
SHEATH ULTRASOUND LTX NONSTRL (SHEATH) IMPLANT
SURGILUBE 2OZ TUBE FLIPTOP (MISCELLANEOUS) ×2 IMPLANT
SYR 10ML LL (SYRINGE) IMPLANT
SYR CONTROL 10ML LL (SYRINGE) ×2 IMPLANT
TOWEL OR 17X26 10 PK STRL BLUE (TOWEL DISPOSABLE) ×2 IMPLANT
UNDERPAD 30X36 HEAVY ABSORB (UNDERPADS AND DIAPERS) ×2 IMPLANT

## 2021-12-08 NOTE — Anesthesia Postprocedure Evaluation (Signed)
Anesthesia Post Note ? ?Patient: Ethan Morton ? ?Procedure(s) Performed: GOLD SEED IMPLANT (Prostate) ?SPACE OAR INSTILLATION (Prostate) ? ?  ? ?Patient location during evaluation: PACU ?Anesthesia Type: MAC ?Level of consciousness: awake and alert ?Pain management: pain level controlled ?Vital Signs Assessment: post-procedure vital signs reviewed and stable ?Respiratory status: spontaneous breathing, nonlabored ventilation, respiratory function stable and patient connected to nasal cannula oxygen ?Cardiovascular status: stable and blood pressure returned to baseline ?Postop Assessment: no apparent nausea or vomiting ?Anesthetic complications: no ? ? ?No notable events documented. ? ?Last Vitals:  ?Vitals:  ? 12/08/21 1115 12/08/21 1130  ?BP: 123/81 124/78  ?Pulse: (!) 51 (!) 52  ?Resp: 14 15  ?Temp:  36.4 ?C  ?SpO2: 97% 97%  ?  ?Last Pain:  ?Vitals:  ? 12/08/21 1130  ?TempSrc:   ?PainSc: 0-No pain  ? ? ?  ?  ?  ?  ?  ?  ? ?Barnet [place] ? ? ? ? ?

## 2021-12-08 NOTE — Interval H&P Note (Signed)
History and Physical Interval Note: ? ?12/08/2021 ?9:34 AM ? ?Ethan Morton  has presented today for surgery, with the diagnosis of PROSTATE CANCER.  The various methods of treatment have been discussed with the patient and family. After consideration of risks, benefits and other options for treatment, the patient has consented to  Procedure(s) with comments: ?GOLD SEED IMPLANT (N/A) ?SPACE OAR INSTILLATION (N/A) - 30 MINS FOR THIS CASE as a surgical intervention.  The patient's history has been reviewed, patient examined, no change in status, stable for surgery.  I have reviewed the patient's chart and labs.  Questions were answered to the patient's satisfaction.   ? ? ?Ethan Morton ? ? ?

## 2021-12-08 NOTE — Anesthesia Procedure Notes (Signed)
Procedure Name: [place] ?Date/Time: 12/08/2021 10:40 AM ?Performed by: Bonney Aid, CRNA ?Pre-anesthesia Checklist: Patient identified, Emergency Drugs available, Suction available, Patient being monitored and Timeout performed ?Patient Re-evaluated:Patient Re-evaluated prior to induction ?Oxygen Delivery Method: Nasal cannula ?Placement Confirmation: positive ETCO2 ? ? ? ? ?

## 2021-12-08 NOTE — Transfer of Care (Signed)
Immediate Anesthesia Transfer of Care Note ? ?Patient: Ethan Morton ? ?Procedure(s) Performed: GOLD SEED IMPLANT (Prostate) ?SPACE OAR INSTILLATION (Prostate) ? ?Patient Location: PACU ? ?Anesthesia Type:MAC ? ?Level of Consciousness: drowsy ? ?Airway & Oxygen Therapy: Patient Spontanous Breathing and Patient connected to nasal cannula oxygen ? ?Post-op Assessment: Report given to RN ? ?Post vital signs: Reviewed and stable ? ?Last Vitals:  ?Vitals Value Taken Time  ?BP 123/77   ?Temp    ?Pulse 68   ?Resp 14 12/08/21 1110  ?SpO2 100   ?Vitals shown include unvalidated device data. ? ?Last Pain:  ?Vitals:  ? 12/08/21 0847  ?TempSrc: Oral  ?PainSc: 0-No pain  ?   ? ?Patients Stated Pain Goal: 5 (12/08/21 0847) ? ?Complications: No notable events documented. ?

## 2021-12-08 NOTE — Discharge Instructions (Addendum)
Take it easy today, no vigorous activity ? ?Return to normal activities tomorrow ? ?Report any significant difficulty urinating or fever ? ? ?Post Anesthesia Home Care Instructions ? ?Activity: ?Get plenty of rest for the remainder of the day. A responsible individual must stay with you for 24 hours following the procedure.  ?For the next 24 hours, DO NOT: ?-Drive a car ?-Paediatric nurse ?-Drink alcoholic beverages ?-Take any medication unless instructed by your physician ?-Make any legal decisions or sign important papers. ? ?Meals: ?Start with liquid foods such as gelatin or soup. Progress to regular foods as tolerated. Avoid greasy, spicy, heavy foods. If nausea and/or vomiting occur, drink only clear liquids until the nausea and/or vomiting subsides. Call your physician if vomiting continues. ? ?Special Instructions/Symptoms: ?Your throat may feel dry or sore from the anesthesia or the breathing tube placed in your throat during surgery. If this causes discomfort, gargle with warm salt water. The discomfort should disappear within 24 hours. ? ?If you had a scopolamine patch placed behind your ear for the management of post- operative nausea and/or vomiting: ? ?1. The medication in the patch is effective for 72 hours, after which it should be removed.  Wrap patch in a tissue and discard in the trash. Wash hands thoroughly with soap and water. ?2. You may remove the patch earlier than 72 hours if you experience unpleasant side effects which may include dry mouth, dizziness or visual disturbances. ?3. Avoid touching the patch. Wash your hands with soap and water after contact with the patch. ?    ?

## 2021-12-08 NOTE — Op Note (Signed)
Preoperative diagnosis: Unfavorable intermediate risk prostate cancer ? ?Postoperative diagnosis: Same ?  ?Procedure: Placement of gold fiducial markers in the prostate, placement of SpaceOAR ? ?Surgeon: Diona Fanti ? ?Anesthesia: MAC ? ?Complications: None ? ?Estimated blood loss: Less than 5 mL ? ?Indications: 66 year old male with intermediate risk prostate cancer.  He presents at this time for placement of fiducial markers and SpaceOAR in advance of EBRT.  The procedure as well as expected outcomes/risks and complications have been discussed with him.  He understands and desires to proceed. ? ?Description of procedure: Patient properly identified in the holding area, administered IV antibiotics and taken to the operating room where MAC was administered.  He was then placed in the dorsolithotomy position.  Perineum was prepped with Betadine.  Proper timeout performed. ? ?Transrectal ultrasound probe was placed, and the prostate was imaged both in transverse and sagittal planes.  His perineal skin and the apex of the prostate bilaterally were infiltrated with lidocaine.  Following this, 3 fiducial markers were placed.  2 markers were then placed in the right prostate using ultrasound guidance-1 at the base, 1 at the apex.  The third fiducial marker was placed in the left mid prostate. ? ?Following this, 18-gauge spinal needle was placed within the fatty plane between the prostate and rectum.  This was placed in the mid medial prostate.  Once adequate positioning was seen using ultrasound guidance, approximately 5 cc of saline was utilized to verify position and flow of the saline within this proper plane.  Following this, the syringe was aspirated and no blood was seen.  At this point, the SpaceOAR gel was infiltrated into the space over the time period of approximately 12 seconds.  Adequate separation of the prostate and rectum was then seen.  The needle was then removed, the patient awakened and then taken to the  PACU in stable condition.  He tolerated the procedure well. ?

## 2021-12-09 ENCOUNTER — Encounter (HOSPITAL_BASED_OUTPATIENT_CLINIC_OR_DEPARTMENT_OTHER): Payer: Self-pay | Admitting: Urology

## 2021-12-09 ENCOUNTER — Telehealth: Payer: Self-pay | Admitting: *Deleted

## 2021-12-09 NOTE — Telephone Encounter (Signed)
CALLED PATIENT TO REMIND OF SIM APPT. FOR 12-12-21- ARRIVAL TIME- 1:45 PM @ [place], PATIENT TO ARRIVE WITH FULL BLADDER AND EMPTY BOWEL, LVM FOR A RETURN CALL ?

## 2021-12-11 NOTE — Progress Notes (Signed)
?  Radiation Oncology         [phone]) [phone] ?________________________________ ? ?Name: Ethan Morton MRN: [id number]  ?Date: 12/12/2021  DOB: 1956/04/25 ? ?SIMULATION AND TREATMENT PLANNING NOTE ? ?  ICD-10-CM   ?1. Malignant neoplasm of prostate ([place])  C61   ?  ? ? ?DIAGNOSIS:  66 y.o. gentleman with Stage T1c adenocarcinoma of the prostate with Gleason score of 4+3, and PSA of 10.1. ? ?NARRATIVE:  The patient was brought to the [facility].  Identity was confirmed.  All relevant records and images related to the planned course of therapy were reviewed.  The patient freely provided informed written consent to proceed with treatment after reviewing the details related to the planned course of therapy. The consent form was witnessed and verified by the simulation staff.  Then, the patient was set-up in a stable reproducible supine position for radiation therapy.  A vacuum lock pillow device was custom fabricated to position his legs in a reproducible immobilized position.  Then, I performed a urethrogram under sterile conditions to identify the prostatic apex.  CT images were obtained.  Surface markings were placed.  The CT images were loaded into the planning software.  Then the prostate target and avoidance structures including the rectum, bladder, bowel and hips were contoured.  Treatment planning then occurred.  The radiation prescription was entered and confirmed.  A total of one complex treatment devices was fabricated. I have requested : Intensity Modulated Radiotherapy (IMRT) is medically necessary for this case for the following reason:  Rectal sparing.. ? ?PLAN:  The patient will receive 70 Gy in 28 fractions. ? ?________________________________ ? ?Sheral Apley Tammi Klippel, M.D. ? ?

## 2021-12-12 ENCOUNTER — Ambulatory Visit
Admission: RE | Admit: 2021-12-12 | Discharge: 2021-12-12 | Disposition: A | Discharge status: Home / Self Care | Payer: Medicare Other | Source: Ambulatory Visit | Attending: Radiation Oncology | Admitting: Radiation Oncology

## 2021-12-12 ENCOUNTER — Other Ambulatory Visit: Payer: Self-pay

## 2021-12-12 DIAGNOSIS — Z510 Encounter for antineoplastic radiation therapy: Secondary | ICD-10-CM | POA: Diagnosis not present

## 2021-12-12 DIAGNOSIS — C61 Malignant neoplasm of prostate: Secondary | ICD-10-CM | POA: Diagnosis present

## 2021-12-20 DIAGNOSIS — Z510 Encounter for antineoplastic radiation therapy: Secondary | ICD-10-CM | POA: Diagnosis present

## 2021-12-20 DIAGNOSIS — C61 Malignant neoplasm of prostate: Secondary | ICD-10-CM | POA: Diagnosis present

## 2021-12-21 ENCOUNTER — Ambulatory Visit
Admission: RE | Admit: 2021-12-21 | Discharge: 2021-12-21 | Disposition: A | Discharge status: Home / Self Care | Payer: Medicare Other | Source: Ambulatory Visit | Attending: Radiation Oncology | Admitting: Radiation Oncology

## 2021-12-21 ENCOUNTER — Other Ambulatory Visit: Payer: Self-pay

## 2021-12-21 DIAGNOSIS — Z510 Encounter for antineoplastic radiation therapy: Secondary | ICD-10-CM | POA: Diagnosis not present

## 2021-12-22 ENCOUNTER — Ambulatory Visit
Admission: RE | Admit: 2021-12-22 | Discharge: 2021-12-22 | Disposition: A | Discharge status: Home / Self Care | Payer: Medicare Other | Source: Ambulatory Visit | Attending: Radiation Oncology | Admitting: Radiation Oncology

## 2021-12-22 DIAGNOSIS — Z510 Encounter for antineoplastic radiation therapy: Secondary | ICD-10-CM | POA: Diagnosis not present

## 2021-12-23 ENCOUNTER — Ambulatory Visit
Admission: RE | Admit: 2021-12-23 | Discharge: 2021-12-23 | Disposition: A | Discharge status: Home / Self Care | Payer: Medicare Other | Source: Ambulatory Visit | Attending: Radiation Oncology | Admitting: Radiation Oncology

## 2021-12-23 ENCOUNTER — Other Ambulatory Visit: Payer: Self-pay

## 2021-12-23 DIAGNOSIS — Z510 Encounter for antineoplastic radiation therapy: Secondary | ICD-10-CM | POA: Diagnosis not present

## 2021-12-26 ENCOUNTER — Ambulatory Visit
Admission: RE | Admit: 2021-12-26 | Discharge: 2021-12-26 | Disposition: A | Discharge status: Home / Self Care | Payer: Medicare Other | Source: Ambulatory Visit | Attending: Radiation Oncology | Admitting: Radiation Oncology

## 2021-12-26 ENCOUNTER — Other Ambulatory Visit: Payer: Self-pay

## 2021-12-26 DIAGNOSIS — Z510 Encounter for antineoplastic radiation therapy: Secondary | ICD-10-CM | POA: Diagnosis not present

## 2021-12-27 ENCOUNTER — Ambulatory Visit
Admission: RE | Admit: 2021-12-27 | Discharge: 2021-12-27 | Disposition: A | Discharge status: Home / Self Care | Payer: Medicare Other | Source: Ambulatory Visit | Attending: Radiation Oncology | Admitting: Radiation Oncology

## 2021-12-27 DIAGNOSIS — Z510 Encounter for antineoplastic radiation therapy: Secondary | ICD-10-CM | POA: Diagnosis not present

## 2021-12-28 ENCOUNTER — Other Ambulatory Visit: Payer: Self-pay

## 2021-12-28 ENCOUNTER — Ambulatory Visit
Admission: RE | Admit: 2021-12-28 | Discharge: 2021-12-28 | Disposition: A | Discharge status: Home / Self Care | Payer: Medicare Other | Source: Ambulatory Visit | Attending: Radiation Oncology | Admitting: Radiation Oncology

## 2021-12-28 DIAGNOSIS — Z510 Encounter for antineoplastic radiation therapy: Secondary | ICD-10-CM | POA: Diagnosis not present

## 2021-12-29 ENCOUNTER — Ambulatory Visit
Admission: RE | Admit: 2021-12-29 | Discharge: 2021-12-29 | Disposition: A | Discharge status: Home / Self Care | Payer: Medicare Other | Source: Ambulatory Visit | Attending: Radiation Oncology | Admitting: Radiation Oncology

## 2021-12-29 DIAGNOSIS — Z510 Encounter for antineoplastic radiation therapy: Secondary | ICD-10-CM | POA: Diagnosis not present

## 2021-12-30 ENCOUNTER — Ambulatory Visit
Admission: RE | Admit: 2021-12-30 | Discharge: 2021-12-30 | Disposition: A | Discharge status: Home / Self Care | Payer: Medicare Other | Source: Ambulatory Visit | Attending: Radiation Oncology | Admitting: Radiation Oncology

## 2021-12-30 ENCOUNTER — Other Ambulatory Visit: Payer: Self-pay

## 2021-12-30 DIAGNOSIS — Z510 Encounter for antineoplastic radiation therapy: Secondary | ICD-10-CM | POA: Diagnosis not present

## 2022-01-02 ENCOUNTER — Ambulatory Visit
Admission: RE | Admit: 2022-01-02 | Discharge: 2022-01-02 | Disposition: A | Discharge status: Home / Self Care | Payer: Medicare Other | Source: Ambulatory Visit | Attending: Radiation Oncology | Admitting: Radiation Oncology

## 2022-01-02 ENCOUNTER — Other Ambulatory Visit: Payer: Self-pay

## 2022-01-02 ENCOUNTER — Inpatient Hospital Stay: Payer: Medicare Other | Attending: Radiation Oncology

## 2022-01-02 DIAGNOSIS — Z510 Encounter for antineoplastic radiation therapy: Secondary | ICD-10-CM | POA: Diagnosis not present

## 2022-01-03 ENCOUNTER — Ambulatory Visit
Admission: RE | Admit: 2022-01-03 | Discharge: 2022-01-03 | Disposition: A | Discharge status: Home / Self Care | Payer: Medicare Other | Source: Ambulatory Visit | Attending: Radiation Oncology | Admitting: Radiation Oncology

## 2022-01-03 ENCOUNTER — Other Ambulatory Visit: Payer: Self-pay

## 2022-01-03 DIAGNOSIS — Z510 Encounter for antineoplastic radiation therapy: Secondary | ICD-10-CM | POA: Diagnosis not present

## 2022-01-03 LAB — RAD ONC ARIA SESSION SUMMARY
Course Elapsed Days: 13
Plan Fractions Treated to Date: 10
Plan Prescribed Dose Per Fraction: 2.5 Gy
Plan Total Fractions Prescribed: 28
Plan Total Prescribed Dose: 70 Gy
Reference Point Dosage Given to Date: 25 Gy
Reference Point Session Dosage Given: 2.5 Gy
Session Number: 10

## 2022-01-04 ENCOUNTER — Ambulatory Visit
Admission: RE | Admit: 2022-01-04 | Discharge: 2022-01-04 | Disposition: A | Discharge status: Home / Self Care | Payer: Medicare Other | Source: Ambulatory Visit | Attending: Radiation Oncology | Admitting: Radiation Oncology

## 2022-01-04 ENCOUNTER — Other Ambulatory Visit: Payer: Self-pay

## 2022-01-04 DIAGNOSIS — Z510 Encounter for antineoplastic radiation therapy: Secondary | ICD-10-CM | POA: Diagnosis not present

## 2022-01-04 LAB — RAD ONC ARIA SESSION SUMMARY
Course Elapsed Days: 14
Plan Fractions Treated to Date: 11
Plan Prescribed Dose Per Fraction: 2.5 Gy
Plan Total Fractions Prescribed: 28
Plan Total Prescribed Dose: 70 Gy
Reference Point Dosage Given to Date: 27.5 Gy
Reference Point Session Dosage Given: 2.5 Gy
Session Number: 11

## 2022-01-05 ENCOUNTER — Ambulatory Visit
Admission: RE | Admit: 2022-01-05 | Discharge: 2022-01-05 | Disposition: A | Discharge status: Home / Self Care | Payer: Medicare Other | Source: Ambulatory Visit | Attending: Radiation Oncology | Admitting: Radiation Oncology

## 2022-01-05 ENCOUNTER — Other Ambulatory Visit: Payer: Self-pay

## 2022-01-05 ENCOUNTER — Inpatient Hospital Stay: Payer: Medicare Other

## 2022-01-05 DIAGNOSIS — Z510 Encounter for antineoplastic radiation therapy: Secondary | ICD-10-CM | POA: Diagnosis not present

## 2022-01-05 LAB — RAD ONC ARIA SESSION SUMMARY
Course Elapsed Days: 15
Plan Fractions Treated to Date: 12
Plan Prescribed Dose Per Fraction: 2.5 Gy
Plan Total Fractions Prescribed: 28
Plan Total Prescribed Dose: 70 Gy
Reference Point Dosage Given to Date: 30 Gy
Reference Point Session Dosage Given: 2.5 Gy
Session Number: 12

## 2022-01-06 ENCOUNTER — Ambulatory Visit
Admission: RE | Admit: 2022-01-06 | Discharge: 2022-01-06 | Disposition: A | Discharge status: Home / Self Care | Payer: Medicare Other | Source: Ambulatory Visit | Attending: Radiation Oncology | Admitting: Radiation Oncology

## 2022-01-06 ENCOUNTER — Inpatient Hospital Stay: Payer: Medicare Other

## 2022-01-06 ENCOUNTER — Other Ambulatory Visit: Payer: Self-pay

## 2022-01-06 DIAGNOSIS — Z510 Encounter for antineoplastic radiation therapy: Secondary | ICD-10-CM | POA: Diagnosis not present

## 2022-01-06 LAB — RAD ONC ARIA SESSION SUMMARY
Course Elapsed Days: 16
Plan Fractions Treated to Date: 13
Plan Prescribed Dose Per Fraction: 2.5 Gy
Plan Total Fractions Prescribed: 28
Plan Total Prescribed Dose: 70 Gy
Reference Point Dosage Given to Date: 32.5 Gy
Reference Point Session Dosage Given: 2.5 Gy
Session Number: 13

## 2022-01-09 ENCOUNTER — Ambulatory Visit
Admission: RE | Admit: 2022-01-09 | Discharge: 2022-01-09 | Disposition: A | Discharge status: Home / Self Care | Payer: Medicare Other | Source: Ambulatory Visit | Attending: Radiation Oncology | Admitting: Radiation Oncology

## 2022-01-09 ENCOUNTER — Other Ambulatory Visit: Payer: Self-pay

## 2022-01-09 ENCOUNTER — Inpatient Hospital Stay: Payer: Medicare Other

## 2022-01-09 DIAGNOSIS — Z510 Encounter for antineoplastic radiation therapy: Secondary | ICD-10-CM | POA: Diagnosis not present

## 2022-01-09 LAB — RAD ONC ARIA SESSION SUMMARY
Course Elapsed Days: 19
Plan Fractions Treated to Date: 14
Plan Prescribed Dose Per Fraction: 2.5 Gy
Plan Total Fractions Prescribed: 28
Plan Total Prescribed Dose: 70 Gy
Reference Point Dosage Given to Date: 35 Gy
Reference Point Session Dosage Given: 2.5 Gy
Session Number: 14

## 2022-01-10 ENCOUNTER — Ambulatory Visit
Admission: RE | Admit: 2022-01-10 | Discharge: 2022-01-10 | Disposition: A | Discharge status: Home / Self Care | Payer: Medicare Other | Source: Ambulatory Visit | Attending: Radiation Oncology | Admitting: Radiation Oncology

## 2022-01-10 ENCOUNTER — Other Ambulatory Visit: Payer: Self-pay

## 2022-01-10 DIAGNOSIS — Z510 Encounter for antineoplastic radiation therapy: Secondary | ICD-10-CM | POA: Diagnosis not present

## 2022-01-10 LAB — RAD ONC ARIA SESSION SUMMARY
Course Elapsed Days: 20
Plan Fractions Treated to Date: 15
Plan Prescribed Dose Per Fraction: 2.5 Gy
Plan Total Fractions Prescribed: 28
Plan Total Prescribed Dose: 70 Gy
Reference Point Dosage Given to Date: 37.5 Gy
Reference Point Session Dosage Given: 2.5 Gy
Session Number: 15

## 2022-01-11 ENCOUNTER — Other Ambulatory Visit: Payer: Self-pay

## 2022-01-11 ENCOUNTER — Ambulatory Visit
Admission: RE | Admit: 2022-01-11 | Discharge: 2022-01-11 | Disposition: A | Discharge status: Home / Self Care | Payer: Medicare Other | Source: Ambulatory Visit | Attending: Radiation Oncology | Admitting: Radiation Oncology

## 2022-01-11 ENCOUNTER — Inpatient Hospital Stay: Payer: Medicare Other

## 2022-01-11 DIAGNOSIS — Z510 Encounter for antineoplastic radiation therapy: Secondary | ICD-10-CM | POA: Diagnosis not present

## 2022-01-11 LAB — RAD ONC ARIA SESSION SUMMARY
Course Elapsed Days: 21
Plan Fractions Treated to Date: 16
Plan Prescribed Dose Per Fraction: 2.5 Gy
Plan Total Fractions Prescribed: 28
Plan Total Prescribed Dose: 70 Gy
Reference Point Dosage Given to Date: 40 Gy
Reference Point Session Dosage Given: 2.5 Gy
Session Number: 16

## 2022-01-12 ENCOUNTER — Inpatient Hospital Stay: Payer: Medicare Other

## 2022-01-12 ENCOUNTER — Ambulatory Visit
Admission: RE | Admit: 2022-01-12 | Discharge: 2022-01-12 | Disposition: A | Discharge status: Home / Self Care | Payer: Medicare Other | Source: Ambulatory Visit | Attending: Radiation Oncology | Admitting: Radiation Oncology

## 2022-01-12 ENCOUNTER — Other Ambulatory Visit: Payer: Self-pay

## 2022-01-12 DIAGNOSIS — Z510 Encounter for antineoplastic radiation therapy: Secondary | ICD-10-CM | POA: Diagnosis not present

## 2022-01-12 LAB — RAD ONC ARIA SESSION SUMMARY
Course Elapsed Days: 22
Plan Fractions Treated to Date: 17
Plan Prescribed Dose Per Fraction: 2.5 Gy
Plan Total Fractions Prescribed: 28
Plan Total Prescribed Dose: 70 Gy
Reference Point Dosage Given to Date: 42.5 Gy
Reference Point Session Dosage Given: 2.5 Gy
Session Number: 17

## 2022-01-13 ENCOUNTER — Other Ambulatory Visit: Payer: Self-pay

## 2022-01-13 ENCOUNTER — Inpatient Hospital Stay: Payer: Medicare Other

## 2022-01-13 ENCOUNTER — Ambulatory Visit
Admission: RE | Admit: 2022-01-13 | Discharge: 2022-01-13 | Disposition: A | Discharge status: Home / Self Care | Payer: Medicare Other | Source: Ambulatory Visit | Attending: Radiation Oncology | Admitting: Radiation Oncology

## 2022-01-13 DIAGNOSIS — Z510 Encounter for antineoplastic radiation therapy: Secondary | ICD-10-CM | POA: Diagnosis not present

## 2022-01-13 LAB — RAD ONC ARIA SESSION SUMMARY
Course Elapsed Days: 23
Plan Fractions Treated to Date: 18
Plan Prescribed Dose Per Fraction: 2.5 Gy
Plan Total Fractions Prescribed: 28
Plan Total Prescribed Dose: 70 Gy
Reference Point Dosage Given to Date: 45 Gy
Reference Point Session Dosage Given: 2.5 Gy
Session Number: 18

## 2022-01-16 ENCOUNTER — Inpatient Hospital Stay: Payer: Medicare Other | Attending: Radiation Oncology

## 2022-01-16 ENCOUNTER — Other Ambulatory Visit: Payer: Self-pay

## 2022-01-16 ENCOUNTER — Ambulatory Visit
Admission: RE | Admit: 2022-01-16 | Discharge: 2022-01-16 | Disposition: A | Discharge status: Home / Self Care | Payer: Medicare Other | Source: Ambulatory Visit | Attending: Radiation Oncology | Admitting: Radiation Oncology

## 2022-01-16 DIAGNOSIS — C61 Malignant neoplasm of prostate: Secondary | ICD-10-CM | POA: Insufficient documentation

## 2022-01-16 DIAGNOSIS — Z510 Encounter for antineoplastic radiation therapy: Secondary | ICD-10-CM | POA: Insufficient documentation

## 2022-01-16 LAB — RAD ONC ARIA SESSION SUMMARY
Course Elapsed Days: 26
Plan Fractions Treated to Date: 19
Plan Prescribed Dose Per Fraction: 2.5 Gy
Plan Total Fractions Prescribed: 28
Plan Total Prescribed Dose: 70 Gy
Reference Point Dosage Given to Date: 47.5 Gy
Reference Point Session Dosage Given: 2.5 Gy
Session Number: 19

## 2022-01-17 ENCOUNTER — Other Ambulatory Visit: Payer: Self-pay

## 2022-01-17 ENCOUNTER — Inpatient Hospital Stay: Payer: Medicare Other

## 2022-01-17 ENCOUNTER — Ambulatory Visit
Admission: RE | Admit: 2022-01-17 | Discharge: 2022-01-17 | Disposition: A | Discharge status: Home / Self Care | Payer: Medicare Other | Source: Ambulatory Visit | Attending: Radiation Oncology | Admitting: Radiation Oncology

## 2022-01-17 DIAGNOSIS — Z510 Encounter for antineoplastic radiation therapy: Secondary | ICD-10-CM | POA: Diagnosis not present

## 2022-01-17 LAB — RAD ONC ARIA SESSION SUMMARY
Course Elapsed Days: 27
Plan Fractions Treated to Date: 20
Plan Prescribed Dose Per Fraction: 2.5 Gy
Plan Total Fractions Prescribed: 28
Plan Total Prescribed Dose: 70 Gy
Reference Point Dosage Given to Date: 50 Gy
Reference Point Session Dosage Given: 2.5 Gy
Session Number: 20

## 2022-01-18 ENCOUNTER — Other Ambulatory Visit: Payer: Self-pay

## 2022-01-18 ENCOUNTER — Inpatient Hospital Stay: Payer: Medicare Other

## 2022-01-18 ENCOUNTER — Ambulatory Visit
Admission: RE | Admit: 2022-01-18 | Discharge: 2022-01-18 | Disposition: A | Discharge status: Home / Self Care | Payer: Medicare Other | Source: Ambulatory Visit | Attending: Radiation Oncology | Admitting: Radiation Oncology

## 2022-01-18 DIAGNOSIS — Z510 Encounter for antineoplastic radiation therapy: Secondary | ICD-10-CM | POA: Diagnosis not present

## 2022-01-18 LAB — RAD ONC ARIA SESSION SUMMARY
Course Elapsed Days: 28
Plan Fractions Treated to Date: 21
Plan Prescribed Dose Per Fraction: 2.5 Gy
Plan Total Fractions Prescribed: 28
Plan Total Prescribed Dose: 70 Gy
Reference Point Dosage Given to Date: 52.5 Gy
Reference Point Session Dosage Given: 2.5 Gy
Session Number: 21

## 2022-01-19 ENCOUNTER — Ambulatory Visit
Admission: RE | Admit: 2022-01-19 | Discharge: 2022-01-19 | Disposition: A | Discharge status: Home / Self Care | Payer: Medicare Other | Source: Ambulatory Visit | Attending: Radiation Oncology | Admitting: Radiation Oncology

## 2022-01-19 ENCOUNTER — Inpatient Hospital Stay: Payer: Medicare Other

## 2022-01-19 ENCOUNTER — Other Ambulatory Visit: Payer: Self-pay

## 2022-01-19 DIAGNOSIS — Z510 Encounter for antineoplastic radiation therapy: Secondary | ICD-10-CM | POA: Diagnosis not present

## 2022-01-19 LAB — RAD ONC ARIA SESSION SUMMARY
Course Elapsed Days: 29
Plan Fractions Treated to Date: 22
Plan Prescribed Dose Per Fraction: 2.5 Gy
Plan Total Fractions Prescribed: 28
Plan Total Prescribed Dose: 70 Gy
Reference Point Dosage Given to Date: 55 Gy
Reference Point Session Dosage Given: 2.5 Gy
Session Number: 22

## 2022-01-20 ENCOUNTER — Inpatient Hospital Stay: Payer: Medicare Other

## 2022-01-20 ENCOUNTER — Other Ambulatory Visit: Payer: Self-pay

## 2022-01-20 ENCOUNTER — Ambulatory Visit
Admission: RE | Admit: 2022-01-20 | Discharge: 2022-01-20 | Disposition: A | Discharge status: Home / Self Care | Payer: Medicare Other | Source: Ambulatory Visit | Attending: Radiation Oncology | Admitting: Radiation Oncology

## 2022-01-20 DIAGNOSIS — Z510 Encounter for antineoplastic radiation therapy: Secondary | ICD-10-CM | POA: Diagnosis not present

## 2022-01-20 LAB — RAD ONC ARIA SESSION SUMMARY
Course Elapsed Days: 30
Plan Fractions Treated to Date: 23
Plan Prescribed Dose Per Fraction: 2.5 Gy
Plan Total Fractions Prescribed: 28
Plan Total Prescribed Dose: 70 Gy
Reference Point Dosage Given to Date: 57.5 Gy
Reference Point Session Dosage Given: 2.5 Gy
Session Number: 23

## 2022-01-23 ENCOUNTER — Inpatient Hospital Stay: Payer: Medicare Other

## 2022-01-23 ENCOUNTER — Ambulatory Visit
Admission: RE | Admit: 2022-01-23 | Discharge: 2022-01-23 | Disposition: A | Discharge status: Home / Self Care | Payer: Medicare Other | Source: Ambulatory Visit | Attending: Radiation Oncology | Admitting: Radiation Oncology

## 2022-01-23 ENCOUNTER — Other Ambulatory Visit: Payer: Self-pay

## 2022-01-23 DIAGNOSIS — Z510 Encounter for antineoplastic radiation therapy: Secondary | ICD-10-CM | POA: Diagnosis not present

## 2022-01-23 LAB — RAD ONC ARIA SESSION SUMMARY
Course Elapsed Days: 33
Plan Fractions Treated to Date: 24
Plan Prescribed Dose Per Fraction: 2.5 Gy
Plan Total Fractions Prescribed: 28
Plan Total Prescribed Dose: 70 Gy
Reference Point Dosage Given to Date: 60 Gy
Reference Point Session Dosage Given: 2.5 Gy
Session Number: 24

## 2022-01-24 ENCOUNTER — Other Ambulatory Visit: Payer: Self-pay

## 2022-01-24 ENCOUNTER — Inpatient Hospital Stay: Payer: Medicare Other

## 2022-01-24 ENCOUNTER — Ambulatory Visit
Admission: RE | Admit: 2022-01-24 | Discharge: 2022-01-24 | Disposition: A | Discharge status: Home / Self Care | Payer: Medicare Other | Source: Ambulatory Visit | Attending: Radiation Oncology | Admitting: Radiation Oncology

## 2022-01-24 DIAGNOSIS — Z510 Encounter for antineoplastic radiation therapy: Secondary | ICD-10-CM | POA: Diagnosis not present

## 2022-01-24 LAB — RAD ONC ARIA SESSION SUMMARY
Course Elapsed Days: 34
Plan Fractions Treated to Date: 25
Plan Prescribed Dose Per Fraction: 2.5 Gy
Plan Total Fractions Prescribed: 28
Plan Total Prescribed Dose: 70 Gy
Reference Point Dosage Given to Date: 62.5 Gy
Reference Point Session Dosage Given: 2.5 Gy
Session Number: 25

## 2022-01-25 ENCOUNTER — Ambulatory Visit
Admission: RE | Admit: 2022-01-25 | Discharge: 2022-01-25 | Disposition: A | Discharge status: Home / Self Care | Payer: Medicare Other | Source: Ambulatory Visit | Attending: Radiation Oncology | Admitting: Radiation Oncology

## 2022-01-25 ENCOUNTER — Inpatient Hospital Stay: Payer: Medicare Other

## 2022-01-25 ENCOUNTER — Other Ambulatory Visit: Payer: Self-pay

## 2022-01-25 DIAGNOSIS — Z510 Encounter for antineoplastic radiation therapy: Secondary | ICD-10-CM | POA: Diagnosis not present

## 2022-01-25 LAB — RAD ONC ARIA SESSION SUMMARY
Course Elapsed Days: 35
Plan Fractions Treated to Date: 26
Plan Prescribed Dose Per Fraction: 2.5 Gy
Plan Total Fractions Prescribed: 28
Plan Total Prescribed Dose: 70 Gy
Reference Point Dosage Given to Date: 65 Gy
Reference Point Session Dosage Given: 2.5 Gy
Session Number: 26

## 2022-01-26 ENCOUNTER — Ambulatory Visit
Admission: RE | Admit: 2022-01-26 | Discharge: 2022-01-26 | Disposition: A | Discharge status: Home / Self Care | Payer: Medicare Other | Source: Ambulatory Visit | Attending: Radiation Oncology | Admitting: Radiation Oncology

## 2022-01-26 ENCOUNTER — Inpatient Hospital Stay: Payer: Medicare Other

## 2022-01-26 ENCOUNTER — Other Ambulatory Visit: Payer: Self-pay

## 2022-01-26 DIAGNOSIS — Z510 Encounter for antineoplastic radiation therapy: Secondary | ICD-10-CM | POA: Diagnosis not present

## 2022-01-26 LAB — RAD ONC ARIA SESSION SUMMARY
Course Elapsed Days: 36
Plan Fractions Treated to Date: 27
Plan Prescribed Dose Per Fraction: 2.5 Gy
Plan Total Fractions Prescribed: 28
Plan Total Prescribed Dose: 70 Gy
Reference Point Dosage Given to Date: 67.5 Gy
Reference Point Session Dosage Given: 2.5 Gy
Session Number: 27

## 2022-01-27 ENCOUNTER — Encounter: Payer: Self-pay | Admitting: Urology

## 2022-01-27 ENCOUNTER — Other Ambulatory Visit: Payer: Self-pay

## 2022-01-27 ENCOUNTER — Inpatient Hospital Stay: Payer: Medicare Other

## 2022-01-27 ENCOUNTER — Ambulatory Visit
Admission: RE | Admit: 2022-01-27 | Discharge: 2022-01-27 | Disposition: A | Discharge status: Home / Self Care | Payer: Medicare Other | Source: Ambulatory Visit | Attending: Radiation Oncology | Admitting: Radiation Oncology

## 2022-01-27 DIAGNOSIS — C61 Malignant neoplasm of prostate: Secondary | ICD-10-CM

## 2022-01-27 DIAGNOSIS — Z510 Encounter for antineoplastic radiation therapy: Secondary | ICD-10-CM | POA: Diagnosis not present

## 2022-01-27 LAB — RAD ONC ARIA SESSION SUMMARY
Course Elapsed Days: 37
Plan Fractions Treated to Date: 28
Plan Prescribed Dose Per Fraction: 2.5 Gy
Plan Total Fractions Prescribed: 28
Plan Total Prescribed Dose: 70 Gy
Reference Point Dosage Given to Date: 70 Gy
Reference Point Session Dosage Given: 2.5 Gy
Session Number: 28

## 2022-03-01 ENCOUNTER — Telehealth: Payer: Self-pay

## 2022-03-01 NOTE — Telephone Encounter (Signed)
Left appointment reminder for patient and asked that they return my call [phone], so that I may complete the nursing portion of his 10:30am-03/01/22 telephone appointment w/ Ashlyn Bruning PA-C.

## 2022-03-02 ENCOUNTER — Encounter: Payer: Self-pay | Admitting: Urology

## 2022-03-02 ENCOUNTER — Ambulatory Visit
Admission: RE | Admit: 2022-03-02 | Discharge: 2022-03-02 | Disposition: A | Discharge status: Home / Self Care | Payer: Medicare Other | Source: Ambulatory Visit | Attending: Urology | Admitting: Urology

## 2022-03-02 DIAGNOSIS — C61 Malignant neoplasm of prostate: Secondary | ICD-10-CM

## 2022-03-02 NOTE — Progress Notes (Signed)
Telephone appointment. I verified patient's identity and began nursing interview. Patient reports polyuria, otherwise doing well. No other issues reported at this time.  Meaningful use complete. I-PSS score of 1-mild. No urinary management medications. Urology appointment- None- per patient.  Reminded patient of his 10:30am-03/02/22 telephone appointment w/ Ashlyn Bruning PA-C. I left my extension [phone] in case patient needs anything. Patient verbalized understanding.  Patient contact [phone]

## 2022-03-03 ENCOUNTER — Other Ambulatory Visit: Payer: Self-pay | Admitting: Urology

## 2022-03-03 DIAGNOSIS — C61 Malignant neoplasm of prostate: Secondary | ICD-10-CM

## 2022-03-03 NOTE — Progress Notes (Signed)
Radiation Oncology         ([phone]) [phone] ________________________________  Name: Ethan Morton MRN: [id number]  Date: 03/02/2022  DOB: 1955-12-25  Post Treatment Note  CC: Alric Quan, MD  Diagnosis:   66 y.o. gentleman with Stage T1c adenocarcinoma of the prostate with Gleason score of 4+3, and PSA of 10.1.     Interval Since Last Radiation:  5 weeks  12/21/21 - 01/27/22:   The prostate was treated to 70 Gy in 28 fractions of 2.5 Gy  Narrative:  I spoke with the patient to conduct his routine scheduled 1 month follow up visit via telephone to spare the patient unnecessary potential exposure in the healthcare setting during the current COVID-19 pandemic.  The patient was notified in advance and gave permission to proceed with this visit format.  He tolerated radiation treatment relatively well with only minor urinary irritation and modest fatigue.  He reported increased frequency, urgency, weaker flow of stream in the evenings but denied dysuria, gross hematuria or straining to void.  He also noted loose stools but did not require any medical management.                              On review of systems, the patient states that he is doing well in general.  He continues with some increased frequency and urgency but feels like the flow of stream has improved and he is emptying his bladder well on voiding.  He denies gross hematuria, dysuria, straining to void or incontinence.  He reports a healthy appetite and is maintaining his weight.  He denies abdominal pain, nausea, vomiting, diarrhea or constipation.  He is no longer having any loose stools and reports that his fatigue is gradually improving.  He has continued going to the gym regularly and overall, is quite pleased with his progress to date.  ALLERGIES:  has No Known Allergies.  Meds: Current Outpatient Medications  Medication Sig Dispense Refill   amLODipine (NORVASC) 10 MG tablet Take 10 mg by mouth daily.      Ascorbic Acid (VITAMIN C) 1000 MG tablet Take 1,000 mg by mouth daily.     Cholecalciferol (VITAMIN D-3) 125 MCG (5000 UT) TABS Take 5,000 Units by mouth daily.     losartan (COZAAR) 50 MG tablet Take 50 mg by mouth daily.     niacin 500 MG tablet Take 500 mg by mouth at bedtime.     OLIVE LEAF PO Take 750 mg by mouth daily.     Omega-3 Fatty Acids (FISH OIL PO) Take 840 mg by mouth daily.     tadalafil (CIALIS) 20 MG tablet Take 10 mg by mouth daily as needed for erectile dysfunction.     vitamin B-12 (CYANOCOBALAMIN) 1000 MCG tablet Take 1,000 mcg by mouth daily.     No current facility-administered medications for this encounter.    Physical Findings:  vitals were not taken for this visit.  Pain Assessment Pain Score: 0-No pain/10 Unable to assess due to telephone follow-up visit format.  Lab Findings: Lab Results  Component Value Date   WBC 7.4 08/24/2021   HGB 13.9 12/08/2021   HCT 41.0 12/08/2021   MCV 85.1 08/24/2021   PLT 293 08/24/2021     Radiographic Findings: No results found.  Impression/Plan: 1. 66 y.o. gentleman with Stage T1c adenocarcinoma of the prostate with Gleason score of 4+3, and PSA of  10.1.    He will continue to follow up with urology for ongoing PSA determinations but does not currently have any follow-up appointments scheduled with Dr. Gloriann Loan to his knowledge. He understands what to expect with regards to PSA monitoring going forward. I will look forward to following his response to treatment via correspondence with urology, and would be happy to continue to participate in his care if clinically indicated. I talked to the patient about what to expect in the future, including his risk for erectile dysfunction and rectal bleeding. I encouraged him to call or return to the office if he has any questions regarding his previous radiation or possible radiation side effects. He was comfortable with this plan and will follow up as needed.     [name], PA-C

## 2022-03-03 NOTE — Progress Notes (Signed)
  Radiation Oncology         [phone]) [phone] ________________________________  Name: Ethan Morton MRN: [id number]  Date: 01/27/2022  DOB: Aug 04, 1956  End of Treatment Note  Diagnosis:    66 y.o. gentleman with Stage T1c adenocarcinoma of the prostate with Gleason score of 4+3, and PSA of 10.1.     Indication for treatment:  Curative, Definitive Radiotherapy       Radiation treatment dates:   12/21/21 - 01/27/22  Site/dose:   The prostate was treated to 70 Gy in 28 fractions of 2.5 Gy  Beams/energy:   The patient was treated with IMRT using volumetric arc therapy delivering 6 MV X-rays to clockwise and counterclockwise circumferential arcs with a 90 degree collimator offset to avoid dose scalloping.  Image guidance was performed with daily cone beam CT prior to each fraction to align to gold markers in the prostate and assure proper bladder and rectal fill volumes.  Immobilization was achieved with BodyFix custom mold.  Narrative: The patient tolerated radiation treatment relatively well with only minor urinary irritation and modest fatigue.  He reported increased frequency, urgency, weaker flow of stream in the evenings but denied dysuria, gross hematuria or straining to void.  He also noted loose stools but did not require any medical management.  Plan: The patient has completed radiation treatment. He will return to radiation oncology clinic for routine followup in one month. I advised him to call or return sooner if he has any questions or concerns related to his recovery or treatment. ________________________________  Sheral Apley. Tammi Klippel, M.D.

## 2022-03-10 ENCOUNTER — Telehealth: Payer: Self-pay

## 2022-03-10 NOTE — Telephone Encounter (Signed)
Left voicemail w/ minimal details regarding patient's RX request for something stronger than Cialis. Under the direction of Ashlyn Bruning PA-C. I delivered the message below...  Patient must discuss this specific RX w/ Dr. Alvester Morin, his urologist.   I left my extension [phone] in case patient needs to return my call.

## 2022-03-10 NOTE — Progress Notes (Addendum)
RN obtained follow up appointment with Dr. Gloriann Loan @ [facility] for 8/16 @ 1:45pm.   Voicemail left for patient to call back to confirm appointment.

## 2022-03-28 ENCOUNTER — Encounter: Payer: Self-pay | Admitting: *Deleted

## 2022-04-20 ENCOUNTER — Encounter: Payer: Self-pay | Admitting: *Deleted

## 2022-07-25 ENCOUNTER — Emergency Department (HOSPITAL_COMMUNITY): Payer: Medicare Other

## 2022-07-25 ENCOUNTER — Other Ambulatory Visit: Payer: Self-pay

## 2022-07-25 ENCOUNTER — Emergency Department (HOSPITAL_COMMUNITY)
Admission: EM | Admit: 2022-07-25 | Discharge: 2022-07-26 | Disposition: A | Discharge status: Home / Self Care | Payer: Medicare Other | Attending: Emergency Medicine | Admitting: Emergency Medicine

## 2022-07-25 ENCOUNTER — Encounter (HOSPITAL_COMMUNITY): Payer: Self-pay

## 2022-07-25 DIAGNOSIS — M25512 Pain in left shoulder: Secondary | ICD-10-CM | POA: Insufficient documentation

## 2022-07-25 DIAGNOSIS — Z5321 Procedure and treatment not carried out due to patient leaving prior to being seen by health care provider: Secondary | ICD-10-CM | POA: Insufficient documentation

## 2022-07-25 DIAGNOSIS — M542 Cervicalgia: Secondary | ICD-10-CM | POA: Diagnosis not present

## 2022-07-25 DIAGNOSIS — Y9301 Activity, walking, marching and hiking: Secondary | ICD-10-CM | POA: Diagnosis not present

## 2022-07-25 DIAGNOSIS — R42 Dizziness and giddiness: Secondary | ICD-10-CM | POA: Diagnosis present

## 2022-07-25 DIAGNOSIS — W228XXA Striking against or struck by other objects, initial encounter: Secondary | ICD-10-CM | POA: Diagnosis not present

## 2022-07-25 DIAGNOSIS — R55 Syncope and collapse: Secondary | ICD-10-CM | POA: Diagnosis not present

## 2022-07-25 DIAGNOSIS — R040 Epistaxis: Secondary | ICD-10-CM | POA: Insufficient documentation

## 2022-07-25 LAB — CBC WITH DIFFERENTIAL/PLATELET
Abs Immature Granulocytes: 0.02 10*3/uL (ref 0.00–0.07)
Basophils Absolute: 0 10*3/uL (ref 0.0–0.1)
Basophils Relative: 1 %
Eosinophils Absolute: 0.2 10*3/uL (ref 0.0–0.5)
Eosinophils Relative: 2 %
HCT: 41.6 % (ref 39.0–52.0)
Hemoglobin: 14.2 g/dL (ref 13.0–17.0)
Immature Granulocytes: 0 %
Lymphocytes Relative: 19 %
Lymphs Abs: 1.4 10*3/uL (ref 0.7–4.0)
MCH: 29.2 pg (ref 26.0–34.0)
MCHC: 34.1 g/dL (ref 30.0–36.0)
MCV: 85.6 fL (ref 80.0–100.0)
Monocytes Absolute: 0.8 10*3/uL (ref 0.1–1.0)
Monocytes Relative: 12 %
Neutro Abs: 4.6 10*3/uL (ref 1.7–7.7)
Neutrophils Relative %: 66 %
Platelets: 222 10*3/uL (ref 150–400)
RBC: 4.86 MIL/uL (ref 4.22–5.81)
RDW: 14.5 % (ref 11.5–15.5)
WBC: 7 10*3/uL (ref 4.0–10.5)
nRBC: 0 % (ref 0.0–0.2)

## 2022-07-25 LAB — COMPREHENSIVE METABOLIC PANEL
ALT: 20 U/L (ref 0–44)
AST: 18 U/L (ref 15–41)
Albumin: 4.1 g/dL (ref 3.5–5.0)
Alkaline Phosphatase: 61 U/L (ref 38–126)
Anion gap: 13 (ref 5–15)
BUN: 34 mg/dL — ABNORMAL HIGH (ref 8–23)
CO2: 21 mmol/L — ABNORMAL LOW (ref 22–32)
Calcium: 9.5 mg/dL (ref 8.9–10.3)
Chloride: 107 mmol/L (ref 98–111)
Creatinine, Ser: 1.91 mg/dL — ABNORMAL HIGH (ref 0.61–1.24)
GFR, Estimated: 38 mL/min — ABNORMAL LOW (ref >60)
Glucose, Bld: 101 mg/dL — ABNORMAL HIGH (ref 70–99)
Potassium: 3.7 mmol/L (ref 3.5–5.1)
Sodium: 141 mmol/L (ref 135–145)
Total Bilirubin: 0.8 mg/dL (ref 0.3–1.2)
Total Protein: 7.5 g/dL (ref 6.5–8.1)

## 2022-07-25 LAB — TROPONIN I (HIGH SENSITIVITY): Troponin I (High Sensitivity): 12 ng/L (ref <18)

## 2022-07-25 NOTE — ED Provider Triage Note (Signed)
Emergency Medicine Provider Triage Evaluation Note  Ethan Morton , a 66 y.o. male  was evaluated in triage.  Pt complains of syncope.  Patient was walking, he felt dizzy like he was going to pass out, he had a syncopal episode is unsure how long he was conscious.  Hit his face against the pavement, bleeding over left eyebrow and nasal bridge but no significant laceration more underwent abrasion.  He is having pain to the left side of his neck when he moves it but not at rest.  Denies any vision changes, lateralized weakness or numbness, chest pain or shortness of breath..  Review of Systems  Per HPI  Physical Exam  BP (!) 153/77 (BP Location: Left Arm)   Pulse 62   Temp 98.1 F (36.7 C) (Oral)   Resp 18   Ht 6' (1.829 m)   Wt 88.5 kg   SpO2 98%   BMI 26.45 kg/m  Gen:   Awake, no distress   Resp:  Normal effort  MSK:   Moves extremities without difficulty  Other:  Cranial nerves II to XII grossly intact.  Normal finger-nose, no pronator drift.  Lower extremity strength symmetric bilaterally.  Superficial abrasion over nasal bridge, eyebrow.  S1-S2.  Medical Decision Making  Medically screening exam initiated at 6:05 PM.  Appropriate orders placed.  Jacquenette Shone was informed that the remainder of the evaluation will be completed by another provider, this initial triage assessment does not replace that evaluation, and the importance of remaining in the ED until their evaluation is complete.     Sherrill Raring, PA-C 07/25/22 1807

## 2022-07-25 NOTE — ED Notes (Signed)
Patient wife states he is staying

## 2022-07-25 NOTE — ED Notes (Signed)
Patient states that he can no longer wait. Advised the patient to stay. Patient left

## 2022-07-25 NOTE — ED Triage Notes (Signed)
Patient reports he was walking down the hall and got super dizzy and had syncopal episode hitting face on the floor.  Bleeding noted to nose.  Patient reports he remembers he was dizzy and next thing he knew he heard his dogs barking and he told them to shut up.  Denies headache.  Complains left shoulder and neck pain. No blood thinners.

## 2022-07-26 NOTE — ED Notes (Signed)
PATIENT LEFT AMA 

## 2022-07-26 NOTE — ED Notes (Signed)
Security states this patient left

## 2022-10-16 ENCOUNTER — Other Ambulatory Visit: Payer: Self-pay | Admitting: Urology

## 2022-11-15 ENCOUNTER — Encounter (HOSPITAL_COMMUNITY)
Admission: RE | Admit: 2022-11-15 | Discharge: 2022-11-15 | Disposition: A | Discharge status: Home / Self Care | Payer: Medicare Other | Source: Ambulatory Visit | Attending: Urology | Admitting: Urology

## 2022-11-15 ENCOUNTER — Encounter (HOSPITAL_COMMUNITY): Payer: Self-pay

## 2022-11-15 VITALS — BP 124/84 | HR 94 | Temp 98.6°F | Resp 16 | Ht 72.0 in | Wt 192.2 lb

## 2022-11-15 DIAGNOSIS — Z01812 Encounter for preprocedural laboratory examination: Secondary | ICD-10-CM | POA: Diagnosis present

## 2022-11-15 DIAGNOSIS — I10 Essential (primary) hypertension: Secondary | ICD-10-CM | POA: Insufficient documentation

## 2022-11-15 LAB — BASIC METABOLIC PANEL
Anion gap: 10 (ref 5–15)
BUN: 32 mg/dL — ABNORMAL HIGH (ref 8–23)
CO2: 27 mmol/L (ref 22–32)
Calcium: 9.8 mg/dL (ref 8.9–10.3)
Chloride: 103 mmol/L (ref 98–111)
Creatinine, Ser: 1.6 mg/dL — ABNORMAL HIGH (ref 0.61–1.24)
GFR, Estimated: 47 mL/min — ABNORMAL LOW (ref >60)
Glucose, Bld: 114 mg/dL — ABNORMAL HIGH (ref 70–99)
Potassium: 3.4 mmol/L — ABNORMAL LOW (ref 3.5–5.1)
Sodium: 140 mmol/L (ref 135–145)

## 2022-11-15 LAB — CBC
HCT: 42.4 % (ref 39.0–52.0)
Hemoglobin: 14.3 g/dL (ref 13.0–17.0)
MCH: 28.8 pg (ref 26.0–34.0)
MCHC: 33.7 g/dL (ref 30.0–36.0)
MCV: 85.5 fL (ref 80.0–100.0)
Platelets: 255 10*3/uL (ref 150–400)
RBC: 4.96 MIL/uL (ref 4.22–5.81)
RDW: 13.7 % (ref 11.5–15.5)
WBC: 5.4 10*3/uL (ref 4.0–10.5)
nRBC: 0 % (ref 0.0–0.2)

## 2022-11-15 NOTE — Patient Instructions (Addendum)
SURGICAL WAITING ROOM VISITATION  Patients having surgery or a procedure may have no more than 2 support people in the waiting area - these visitors may rotate.    Children under the age of 59 must have an adult with them who is not the patient.  Due to an increase in RSV and influenza rates and associated hospitalizations, children ages 69 and under may not visit patients in [place].  If the patient needs to stay at the hospital during part of their recovery, the visitor guidelines for inpatient rooms apply. Pre-op nurse will coordinate an appropriate time for 1 support person to accompany patient in pre-op.  This support person may not rotate.    Please refer to the [facility] website for the visitor guidelines for Inpatients (after your surgery is over and you are in a regular room).    Your procedure is scheduled on: 11/28/22   Report to [facility] Main Entrance    Report to admitting at 7:45 AM   Call this number if you have problems the morning of surgery [phone]   Do not eat food or drink liquids :After Midnight.          If you have questions, please contact your surgeon's office.   FOLLOW BOWEL PREP AND ANY ADDITIONAL PRE OP INSTRUCTIONS YOU RECEIVED FROM YOUR SURGEON'S OFFICE!!!     Oral Hygiene is also important to reduce your risk of infection.                                    Remember - BRUSH YOUR TEETH THE MORNING OF SURGERY WITH YOUR REGULAR TOOTHPASTE  DENTURES WILL BE REMOVED PRIOR TO SURGERY PLEASE DO NOT APPLY "Poly grip" OR ADHESIVES!!!   Take these medicines the morning of surgery with A SIP OF WATER: Amlodipine                               You may not have any metal on your body including jewelry, and body piercing             Do not wear lotions, powders, cologne, or deodorant  Do not shave  48 hours prior to surgery.               Men may shave face and neck.   Do not bring valuables to the hospital. [place].   Contacts, glasses, dentures or bridgework may not be worn into surgery.   Bring small overnight bag day of surgery.   DO NOT [place]. PHARMACY WILL DISPENSE MEDICATIONS LISTED ON YOUR MEDICATION LIST TO YOU DURING YOUR ADMISSION [place]!   Special Instructions: Bring a copy of your healthcare power of attorney and living will documents the day of surgery if you haven't scanned them before.              Please read over the following fact sheets you were given: IF Ethan Morton [name]    If you received a COVID test during your pre-op visit  it is requested that you wear a mask when out in public, stay away from anyone that may not be feeling well  and notify your surgeon if you develop symptoms. If you test positive for Covid or have been in contact with anyone that has tested positive in the last 10 days please notify you surgeon.    [facility] [place] - Preparing for Surgery Before surgery, you can play an important role.  Because skin is not sterile, your skin needs to be as free of germs as possible.  You can reduce the number of germs on your skin by washing with CHG (chlorahexidine gluconate) soap before surgery.  CHG is an antiseptic cleaner which kills germs and bonds with the skin to continue killing germs even after washing. Please DO NOT use if you have an allergy to CHG or antibacterial soaps.  If your skin becomes reddened/irritated stop using the CHG and inform your nurse when you arrive at Short Stay. Do not shave (including legs and underarms) for at least 48 hours prior to the first CHG shower.  You may shave your face/neck.  Please follow these instructions carefully:  1.  Shower with CHG Soap the night before surgery and the  morning of surgery.  2.  If you choose to wash your hair, wash your hair first as usual with your normal   shampoo.  3.  After you shampoo, rinse your hair and body thoroughly to remove the shampoo.                             4.  Use CHG as you would any other liquid soap.  You can apply chg directly to the skin and wash.  Gently with a scrungie or clean washcloth.  5.  Apply the CHG Soap to your body ONLY FROM THE NECK DOWN.   Do   not use on face/ open                           Wound or open sores. Avoid contact with eyes, ears mouth and   genitals (private parts).                       Wash face,  Genitals (private parts) with your normal soap.             6.  Wash thoroughly, paying special attention to the area where your    surgery  will be performed.  7.  Thoroughly rinse your body with warm water from the neck down.  8.  DO NOT shower/wash with your normal soap after using and rinsing off the CHG Soap.                9.  Pat yourself dry with a clean towel.            10.  Wear clean pajamas.            11.  Place clean sheets on your bed the night of your first shower and do not  sleep with pets. Day of Surgery : Do not apply any lotions/deodorants the morning of surgery.  Please wear clean clothes to the hospital/surgery center.  FAILURE TO FOLLOW THESE INSTRUCTIONS MAY RESULT IN THE CANCELLATION OF YOUR SURGERY  PATIENT SIGNATURE_________________________________  NURSE SIGNATURE__________________________________  ________________________________________________________________________

## 2022-11-15 NOTE — Progress Notes (Addendum)
COVID Vaccine Completed: yes  Date of COVID positive in last 90 days: no  PCP - Junie Spencer, PA Cardiologist - Carollee Herter, MD LOV 09/29/21 for dizziness   Chest x-ray - 07/25/22 Epic EKG - 07/26/22 Epic Stress Test - n/a ECHO - 07/19/21 [facility] Cardiac Cath - n/a Pacemaker/ICD device last checked: n/a Spinal Cord Stimulator: n/a  Bowel Prep - no  Sleep Study - n/a CPAP -   Fasting Blood Sugar - n/a Checks Blood Sugar _____ times a day  Last dose of GLP1 agonist-  N/A GLP1 instructions:  N/A   Last dose of SGLT-2 inhibitors-  N/A SGLT-2 instructions: N/A   Blood Thinner Instructions: n/a Aspirin Instructions: Last Dose:  Activity level: Can go up a flight of stairs and perform activities of daily living without stopping and without symptoms of chest pain or shortness of breath.   Anesthesia review: dizziness, HTN  Patient denies shortness of breath, fever, cough and chest pain at PAT appointment  Patient verbalized understanding of instructions that were given to them at the PAT appointment. Patient was also instructed that they will need to review over the PAT instructions again at home before surgery.

## 2022-11-16 LAB — HEMOGLOBIN A1C
Hgb A1c MFr Bld: 5.8 % — ABNORMAL HIGH (ref 4.8–5.6)
Mean Plasma Glucose: 120 mg/dL

## 2022-11-17 LAB — URINE CULTURE: Culture: NO GROWTH

## 2022-11-28 ENCOUNTER — Encounter (HOSPITAL_COMMUNITY)
Admission: RE | Disposition: A | Discharge status: Home / Self Care | Payer: Self-pay | Source: Home / Self Care | Attending: Urology

## 2022-11-28 ENCOUNTER — Ambulatory Visit (HOSPITAL_BASED_OUTPATIENT_CLINIC_OR_DEPARTMENT_OTHER): Payer: Medicare Other | Admitting: Physician Assistant

## 2022-11-28 ENCOUNTER — Ambulatory Visit (HOSPITAL_COMMUNITY): Payer: Medicare Other | Admitting: Physician Assistant

## 2022-11-28 ENCOUNTER — Other Ambulatory Visit: Payer: Self-pay

## 2022-11-28 ENCOUNTER — Observation Stay (HOSPITAL_COMMUNITY)
Admission: RE | Admit: 2022-11-28 | Discharge: 2022-11-29 | Disposition: A | Discharge status: Home / Self Care | Payer: Medicare Other | Attending: Urology | Admitting: Urology

## 2022-11-28 ENCOUNTER — Encounter (HOSPITAL_COMMUNITY): Payer: Self-pay | Admitting: Urology

## 2022-11-28 DIAGNOSIS — Z79899 Other long term (current) drug therapy: Secondary | ICD-10-CM | POA: Insufficient documentation

## 2022-11-28 DIAGNOSIS — N528 Other male erectile dysfunction: Principal | ICD-10-CM | POA: Insufficient documentation

## 2022-11-28 DIAGNOSIS — N529 Male erectile dysfunction, unspecified: Secondary | ICD-10-CM

## 2022-11-28 DIAGNOSIS — Z8546 Personal history of malignant neoplasm of prostate: Secondary | ICD-10-CM | POA: Diagnosis not present

## 2022-11-28 DIAGNOSIS — I10 Essential (primary) hypertension: Secondary | ICD-10-CM | POA: Diagnosis not present

## 2022-11-28 DIAGNOSIS — Z960 Presence of urogenital implants: Secondary | ICD-10-CM

## 2022-11-28 HISTORY — PX: PENILE PROSTHESIS IMPLANT: SHX240

## 2022-11-28 SURGERY — INSERTION, PENILE PROSTHESIS, INFLATABLE
Anesthesia: General

## 2022-11-28 MED ORDER — MUPIROCIN 2 % EX OINT
1.0000 | TOPICAL_OINTMENT | Freq: Once | CUTANEOUS | Status: AC
  Administered 2022-11-28: 1 via NASAL
  Filled 2022-11-28: qty 22

## 2022-11-28 MED ORDER — GENTAMICIN SULFATE 40 MG/ML IJ SOLN
5.0000 mg/kg | Freq: Once | INTRAVENOUS | Status: AC
  Administered 2022-11-28: 440 mg via INTRAVENOUS
  Filled 2022-11-28: qty 11

## 2022-11-28 MED ORDER — BUPIVACAINE HCL (PF) 0.5 % IJ SOLN
INTRAMUSCULAR | Status: DC | PRN
  Administered 2022-11-28: 10 mL

## 2022-11-28 MED ORDER — IRRISEPT - 450ML BOTTLE WITH 0.05% CHG IN STERILE WATER, USP 99.95% OPTIME
TOPICAL | Status: DC | PRN
  Administered 2022-11-28: 450 mL via TOPICAL

## 2022-11-28 MED ORDER — CHLORHEXIDINE GLUCONATE 0.12 % MT SOLN
15.0000 mL | Freq: Once | OROMUCOSAL | Status: AC
  Administered 2022-11-28: 15 mL via OROMUCOSAL

## 2022-11-28 MED ORDER — ESMOLOL HCL 100 MG/10ML IV SOLN
INTRAVENOUS | Status: AC
  Filled 2022-11-28: qty 10

## 2022-11-28 MED ORDER — KETOROLAC TROMETHAMINE 15 MG/ML IJ SOLN
INTRAMUSCULAR | Status: DC | PRN
  Administered 2022-11-28: 15 mg via INTRAVENOUS

## 2022-11-28 MED ORDER — LIDOCAINE HCL (CARDIAC) PF 100 MG/5ML IV SOSY
PREFILLED_SYRINGE | INTRAVENOUS | Status: DC | PRN
  Administered 2022-11-28: 60 mg via INTRAVENOUS

## 2022-11-28 MED ORDER — CHLORHEXIDINE GLUCONATE 4 % EX LIQD: Freq: Once | CUTANEOUS | Status: DC

## 2022-11-28 MED ORDER — EPHEDRINE SULFATE-NACL 50-0.9 MG/10ML-% IV SOSY
PREFILLED_SYRINGE | INTRAVENOUS | Status: DC | PRN
  Administered 2022-11-28: 5 mg via INTRAVENOUS

## 2022-11-28 MED ORDER — VANCOMYCIN HCL IN DEXTROSE 1-5 GM/200ML-% IV SOLN
1000.0000 mg | Freq: Once | INTRAVENOUS | Status: AC
  Administered 2022-11-28: 1000 mg via INTRAVENOUS
  Filled 2022-11-28: qty 200

## 2022-11-28 MED ORDER — PROPOFOL 10 MG/ML IV BOLUS
INTRAVENOUS | Status: AC
  Filled 2022-11-28: qty 20

## 2022-11-28 MED ORDER — FENTANYL CITRATE (PF) 100 MCG/2ML IJ SOLN
INTRAMUSCULAR | Status: AC
  Filled 2022-11-28: qty 2

## 2022-11-28 MED ORDER — ACETAMINOPHEN 500 MG PO TABS: 1000.0000 mg | ORAL_TABLET | Freq: Four times a day (QID) | ORAL | 0 refills | Status: AC

## 2022-11-28 MED ORDER — MIDAZOLAM HCL 5 MG/5ML IJ SOLN
INTRAMUSCULAR | Status: DC | PRN
  Administered 2022-11-28: 2 mg via INTRAVENOUS

## 2022-11-28 MED ORDER — OXYCODONE HCL 5 MG PO TABS: 5.0000 mg | ORAL_TABLET | ORAL | 0 refills | Status: AC | PRN

## 2022-11-28 MED ORDER — ONDANSETRON HCL 4 MG/2ML IJ SOLN: 4.0000 mg | INTRAMUSCULAR | Status: DC | PRN

## 2022-11-28 MED ORDER — CELECOXIB 200 MG PO CAPS
200.0000 mg | ORAL_CAPSULE | Freq: Two times a day (BID) | ORAL | Status: DC
  Administered 2022-11-28 – 2022-11-29 (×2): 200 mg via ORAL
  Filled 2022-11-28 (×2): qty 1

## 2022-11-28 MED ORDER — OXYCODONE HCL 5 MG PO TABS: 5.0000 mg | ORAL_TABLET | Freq: Once | ORAL | Status: DC | PRN

## 2022-11-28 MED ORDER — ONDANSETRON HCL 4 MG/2ML IJ SOLN
INTRAMUSCULAR | Status: DC | PRN
  Administered 2022-11-28: 4 mg via INTRAVENOUS

## 2022-11-28 MED ORDER — KETOROLAC TROMETHAMINE 30 MG/ML IJ SOLN
INTRAMUSCULAR | Status: AC
  Filled 2022-11-28: qty 1

## 2022-11-28 MED ORDER — EPHEDRINE 5 MG/ML INJ
INTRAVENOUS | Status: AC
  Filled 2022-11-28: qty 5

## 2022-11-28 MED ORDER — FLUCONAZOLE IN SODIUM CHLORIDE 200-0.9 MG/100ML-% IV SOLN
200.0000 mg | INTRAVENOUS | Status: DC
  Administered 2022-11-28: 200 mg via INTRAVENOUS
  Filled 2022-11-28: qty 100

## 2022-11-28 MED ORDER — FENTANYL CITRATE PF 50 MCG/ML IJ SOSY: 25.0000 ug | PREFILLED_SYRINGE | INTRAMUSCULAR | Status: DC | PRN

## 2022-11-28 MED ORDER — ACETAMINOPHEN 500 MG PO TABS
1000.0000 mg | ORAL_TABLET | Freq: Four times a day (QID) | ORAL | Status: DC
  Administered 2022-11-28 – 2022-11-29 (×3): 1000 mg via ORAL
  Filled 2022-11-28 (×3): qty 2

## 2022-11-28 MED ORDER — LIDOCAINE HCL (PF) 1 % IJ SOLN
INTRAMUSCULAR | Status: DC | PRN
  Administered 2022-11-28: 10 mL

## 2022-11-28 MED ORDER — FENTANYL CITRATE (PF) 100 MCG/2ML IJ SOLN: INTRAMUSCULAR | Status: DC | PRN

## 2022-11-28 MED ORDER — KETAMINE HCL 10 MG/ML IJ SOLN
INTRAMUSCULAR | Status: DC | PRN
  Administered 2022-11-28: 15 mg via INTRAVENOUS
  Administered 2022-11-28: 25 mg via INTRAVENOUS

## 2022-11-28 MED ORDER — LIDOCAINE HCL 2 % IJ SOLN
INTRAMUSCULAR | Status: AC
  Filled 2022-11-28: qty 20

## 2022-11-28 MED ORDER — DEXAMETHASONE SODIUM PHOSPHATE 10 MG/ML IJ SOLN
INTRAMUSCULAR | Status: AC
  Filled 2022-11-28: qty 1

## 2022-11-28 MED ORDER — AMLODIPINE BESYLATE 10 MG PO TABS
10.0000 mg | ORAL_TABLET | Freq: Every day | ORAL | Status: DC
  Filled 2022-11-28: qty 1

## 2022-11-28 MED ORDER — HYDROMORPHONE HCL 1 MG/ML IJ SOLN: 0.5000 mg | INTRAMUSCULAR | Status: DC | PRN

## 2022-11-28 MED ORDER — DEXAMETHASONE SODIUM PHOSPHATE 10 MG/ML IJ SOLN
INTRAMUSCULAR | Status: DC | PRN
  Administered 2022-11-28: 10 mg via INTRAVENOUS

## 2022-11-28 MED ORDER — VANCOMYCIN HCL 1500 MG/300ML IV SOLN
1500.0000 mg | INTRAVENOUS | Status: AC
  Administered 2022-11-28: 1500 mg via INTRAVENOUS
  Filled 2022-11-28: qty 300

## 2022-11-28 MED ORDER — CELECOXIB 200 MG PO CAPS: 200.0000 mg | ORAL_CAPSULE | Freq: Two times a day (BID) | ORAL | 1 refills | Status: AC

## 2022-11-28 MED ORDER — LACTATED RINGERS IV SOLN: INTRAVENOUS | Status: DC

## 2022-11-28 MED ORDER — SULFAMETHOXAZOLE-TRIMETHOPRIM 800-160 MG PO TABS: 1.0000 | ORAL_TABLET | Freq: Two times a day (BID) | ORAL | 0 refills | Status: AC

## 2022-11-28 MED ORDER — MIDAZOLAM HCL 2 MG/2ML IJ SOLN
INTRAMUSCULAR | Status: AC
  Filled 2022-11-28: qty 2

## 2022-11-28 MED ORDER — ONDANSETRON HCL 4 MG/2ML IJ SOLN: 4.0000 mg | Freq: Four times a day (QID) | INTRAMUSCULAR | Status: DC | PRN

## 2022-11-28 MED ORDER — FENTANYL CITRATE (PF) 100 MCG/2ML IJ SOLN
INTRAMUSCULAR | Status: DC | PRN
  Administered 2022-11-28: 50 ug via INTRAVENOUS
  Administered 2022-11-28: 25 ug via INTRAVENOUS
  Administered 2022-11-28: 50 ug via INTRAVENOUS
  Administered 2022-11-28: 25 ug via INTRAVENOUS
  Administered 2022-11-28: 50 ug via INTRAVENOUS

## 2022-11-28 MED ORDER — ORAL CARE MOUTH RINSE: 15.0000 mL | Freq: Once | OROMUCOSAL | Status: AC

## 2022-11-28 MED ORDER — BUPIVACAINE HCL (PF) 0.5 % IJ SOLN
INTRAMUSCULAR | Status: AC
  Filled 2022-11-28: qty 30

## 2022-11-28 MED ORDER — ONDANSETRON HCL 4 MG/2ML IJ SOLN
INTRAMUSCULAR | Status: AC
  Filled 2022-11-28: qty 2

## 2022-11-28 MED ORDER — SODIUM CHLORIDE 0.45 % IV SOLN: INTRAVENOUS | Status: DC

## 2022-11-28 MED ORDER — OXYCODONE HCL 5 MG/5ML PO SOLN: 5.0000 mg | Freq: Once | ORAL | Status: DC | PRN

## 2022-11-28 MED ORDER — SODIUM CHLORIDE 0.9 % IR SOLN
Status: DC | PRN
  Administered 2022-11-28: 1000 mL

## 2022-11-28 MED ORDER — KETAMINE HCL 10 MG/ML IJ SOLN
INTRAMUSCULAR | Status: AC
  Filled 2022-11-28: qty 1

## 2022-11-28 MED ORDER — ESMOLOL HCL-SODIUM CHLORIDE 2000 MG/100ML IV SOLN
INTRAVENOUS | Status: DC | PRN
  Administered 2022-11-28 (×2): 20 mg via INTRAVENOUS

## 2022-11-28 MED ORDER — OXYCODONE HCL 5 MG PO TABS
5.0000 mg | ORAL_TABLET | ORAL | Status: DC | PRN
  Administered 2022-11-28: 5 mg via ORAL
  Filled 2022-11-28: qty 1

## 2022-11-28 MED ORDER — LIDOCAINE HCL (PF) 1 % IJ SOLN
INTRAMUSCULAR | Status: AC
  Filled 2022-11-28: qty 30

## 2022-11-28 MED ORDER — LIDOCAINE HCL (PF) 2 % IJ SOLN
INTRAMUSCULAR | Status: AC
  Filled 2022-11-28: qty 5

## 2022-11-28 MED ORDER — PROPOFOL 10 MG/ML IV BOLUS
INTRAVENOUS | Status: DC | PRN
  Administered 2022-11-28: 200 mg via INTRAVENOUS

## 2022-11-28 MED ORDER — STERILE WATER FOR IRRIGATION IR SOLN
Status: DC | PRN
  Administered 2022-11-28: 1000 mL

## 2022-11-28 SURGICAL SUPPLY — 56 items
ADH SKN CLS APL DERMABOND .7 (GAUZE/BANDAGES/DRESSINGS) ×1
APL PRP STRL LF DISP 70% ISPRP (MISCELLANEOUS) ×2
BAG DRN RND TRDRP ANRFLXCHMBR (UROLOGICAL SUPPLIES) ×1
BAG URINE DRAIN 2000ML AR STRL (UROLOGICAL SUPPLIES) ×1 IMPLANT
BLADE SURG 15 STRL LF DISP TIS (BLADE) ×1 IMPLANT
BLADE SURG 15 STRL SS (BLADE) ×1
BNDG GAUZE DERMACEA FLUFF 4 (GAUZE/BANDAGES/DRESSINGS) ×1 IMPLANT
BNDG GZE DERMACEA 4 6PLY (GAUZE/BANDAGES/DRESSINGS) ×1
BRIEF MESH DISP LRG (UNDERPADS AND DIAPERS) ×1 IMPLANT
CATH COUDE 5CC RIBBED (CATHETERS) ×1 IMPLANT
CATH RIBBED COUDE 5CC (CATHETERS) ×1
CHLORAPREP W/TINT 26 (MISCELLANEOUS) ×2 IMPLANT
COVER MAYO STAND STRL (DRAPES) ×1 IMPLANT
COVER SURGICAL LIGHT HANDLE (MISCELLANEOUS) ×1 IMPLANT
DERMABOND ADVANCED .7 DNX12 (GAUZE/BANDAGES/DRESSINGS) ×1 IMPLANT
DRAIN CHANNEL 10F 3/8 F FF (DRAIN) IMPLANT
DRAPE INCISE IOBAN 66X45 STRL (DRAPES) ×1 IMPLANT
DRAPE LAPAROTOMY T 98X78 PEDS (DRAPES) ×1 IMPLANT
DRSG TEGADERM 4X4.75 (GAUZE/BANDAGES/DRESSINGS) ×1 IMPLANT
ELECT REM PT RETURN 15FT ADLT (MISCELLANEOUS) ×1 IMPLANT
EVACUATOR SILICONE 100CC (DRAIN) ×1 IMPLANT
GLOVE BIO SURGEON STRL SZ7 (GLOVE) ×1 IMPLANT
GLOVE BIOGEL PI IND STRL 7.0 (GLOVE) ×1 IMPLANT
GOWN STRL REUS W/ TWL LRG LVL3 (GOWN DISPOSABLE) ×1 IMPLANT
GOWN STRL REUS W/TWL LRG LVL3 (GOWN DISPOSABLE) ×1
HOLDER FOLEY CATH W/STRAP (MISCELLANEOUS) ×1 IMPLANT
IMPL RTE STACKING CX LGX1.5 (Breast) IMPLANT
IMPLANT RTE STACKING CX LGX1.5 (Breast) ×1 IMPLANT
JET LAVAGE IRRISEPT WOUND (IRRIGATION / IRRIGATOR)
KIT ACCESSORY AMS 700 PUMP (Urological Implant) IMPLANT
KIT BASIN OR (CUSTOM PROCEDURE TRAY) ×1 IMPLANT
KIT TURNOVER KIT A (KITS) IMPLANT
LAVAGE JET IRRISEPT WOUND (IRRIGATION / IRRIGATOR) IMPLANT
NDL HYPO 22X1.5 SAFETY MO (MISCELLANEOUS) ×1 IMPLANT
NEEDLE HYPO 22X1.5 SAFETY MO (MISCELLANEOUS) ×1 IMPLANT
NEEDLE SAFETY HYPO 22GAX1.5 (MISCELLANEOUS) ×1
NS IRRIG 1000ML POUR BTL (IV SOLUTION) ×1 IMPLANT
PACK GENERAL/GYN (CUSTOM PROCEDURE TRAY) ×1 IMPLANT
PLUG CATH AND CAP STER (CATHETERS) ×1 IMPLANT
PROS PENILE AMS 700 CX 24 (Urological Implant) ×1 IMPLANT
PROSTHESIS PENIL AMS 700 CX 24 (Urological Implant) IMPLANT
RESERVOIR FLAT IZ 100ML (Miscellaneous) IMPLANT
RETRACTOR DEEP SCROTAL PENILE (MISCELLANEOUS) IMPLANT
RETRACTOR WILSON SYSTEM (INSTRUMENTS) IMPLANT
SET COLLECT BLD 21X.75 12 PB G (NEEDLE) IMPLANT
SURGILUBE 2OZ TUBE FLIPTOP (MISCELLANEOUS) IMPLANT
SUT ETHILON 3 0 PS 1 (SUTURE) IMPLANT
SUT MNCRL AB 4-0 PS2 18 (SUTURE) ×1 IMPLANT
SUT VIC AB 2-0 UR6 27 (SUTURE) ×4 IMPLANT
SUT VIC AB 3-0 SH 27 (SUTURE) ×4
SUT VIC AB 3-0 SH 27X BRD (SUTURE) ×2 IMPLANT
SYR 10ML LL (SYRINGE) ×2 IMPLANT
SYR 50ML LL SCALE MARK (SYRINGE) ×3 IMPLANT
SYR CONTROL 10ML LL (SYRINGE) ×1 IMPLANT
TOWEL OR 17X26 10 PK STRL BLUE (TOWEL DISPOSABLE) ×2 IMPLANT
WATER STERILE IRR 500ML POUR (IV SOLUTION) ×1 IMPLANT

## 2022-11-28 NOTE — Transfer of Care (Signed)
Immediate Anesthesia Transfer of Care Note  Patient: Ethan Morton  Procedure(s) Performed: INSERTION OF INFLATABLE PENILE PROTHESIS  Patient Location: PACU  Anesthesia Type:General  Level of Consciousness: awake, alert , oriented, and patient cooperative  Airway & Oxygen Therapy: Patient Spontanous Breathing and Patient connected to face mask oxygen  Post-op Assessment: Report given to RN, Post -op Vital signs reviewed and stable, and Patient moving all extremities  Post vital signs: Reviewed and stable  Last Vitals:  Vitals Value Taken Time  BP 156/96 11/28/22 1343  Temp 36.7 C 11/28/22 1343  Pulse 71 11/28/22 1344  Resp 11 11/28/22 1346  SpO2 100 % 11/28/22 1344  Vitals shown include unvalidated device data.  Last Pain:  Vitals:   11/28/22 1343  TempSrc:   PainSc: 0-No pain         Complications: No notable events documented.

## 2022-11-28 NOTE — H&P (Signed)
H&P  History of Present Illness: Ethan Morton is a 67 y.o. year old M who presents today for placement of a penile implant.  No acute complaints  Past Medical History:  Diagnosis Date   Dizzy spells    worked up by cardiology 09-30-2021 echo done ef 50 to 55 %   Hypertension    Prostate cancer [facility])    Wears dentures upper    Wears glasses    Wears partial denture lower     Past Surgical History:  Procedure Laterality Date   CARPAL TUNNEL RELEASE Left 2021   colonscopy  2020   GOLD SEED IMPLANT N/A 12/08/2021   Procedure: GOLD SEED IMPLANT;  Surgeon: Ethan Gallo, MD;  Location: [facility];  Service: Urology;  Laterality: N/A;   HIP ARTHROPLASTY Bilateral 2020   PROSTATE BIOPSY     PROSTATE BIOPSY N/A 08/29/2021   Procedure: BIOPSY TRANSRECTAL ULTRASONIC PROSTATE (TUBP);  Surgeon: Ethan Mallow, MD;  Location: [facility];  Service: Urology;  Laterality: N/A;   SPACE OAR INSTILLATION N/A 12/08/2021   Procedure: SPACE OAR INSTILLATION;  Surgeon: Ethan Gallo, MD;  Location: [facility];  Service: Urology;  Laterality: N/A;  30 MINS FOR THIS CASE   WISDOM TOOTH EXTRACTION     yrs ago    Home Medications:  Current Meds  Medication Sig   amLODipine (NORVASC) 10 MG tablet Take 10 mg by mouth daily.   hydrochlorothiazide (HYDRODIURIL) 25 MG tablet Take 25 mg by mouth daily.   tadalafil (CIALIS) 20 MG tablet Take 10 mg by mouth daily as needed for erectile dysfunction.    Allergies: No Known Allergies  History reviewed. No pertinent family history.  Social History:  reports that he has never smoked. He has never used smokeless tobacco. He reports current alcohol use. He reports that he does not currently use drugs after having used the following drugs: Marijuana. Frequency: 5.00 times per week.  ROS: A complete review of systems was performed.  All systems are negative except for pertinent findings as noted.  Physical Exam:  Vital  signs in last 24 hours: Temp:  [98.4 F (36.9 C)] 98.4 F (36.9 C) (03/12 0800) Pulse Rate:  [68] 68 (03/12 0800) Resp:  [16] 16 (03/12 0800) BP: (141)/(85) 141/85 (03/12 0800) SpO2:  [100 %] 100 % (03/12 0800) Constitutional:  Alert and oriented, No acute distress Cardiovascular: Regular rate and rhythm Respiratory: Normal respiratory effort, Lungs clear bilaterally GI: Abdomen is soft, nontender, nondistended, no abdominal masses Lymphatic: No lymphadenopathy Neurologic: Grossly intact, no focal deficits Psychiatric: Normal mood and affect   Laboratory Data:  No results for input(s): "WBC", "HGB", "HCT", "PLT" in the last 72 hours.  No results for input(s): "NA", "K", "CL", "GLUCOSE", "BUN", "CALCIUM", "CREATININE" in the last 72 hours.  Invalid input(s): "CO3"   No results found for this or any previous visit (from the past 24 hour(s)). No results found for this or any previous visit (from the past 240 hour(s)).  Renal Function: No results for input(s): "CREATININE" in the last 168 hours. Estimated Creatinine Clearance: 49.2 mL/min (A) (by C-G formula based on SCr of 1.6 mg/dL (H)).  Radiologic Imaging: No results found.  Assessment:  Ethan Morton is a 67 y.o. year old with ED here today for inflatable penile implant  Plan:  To OR as planned. Procedures and risks reviewed (bleeding, infection, implant infection, implant malfunction, damage to adjacent structures, loss of length). [name]'s recent PSA  was improved but we discussed if he needs further treatment for prostate cancer in the future he may have less desire to use the implant or could lead to complications. He voiced understanding and wishes to proceed.  Ethan Pore, MD 11/28/2022, 10:42 AM  [facility] Pager: [phone]

## 2022-11-28 NOTE — Plan of Care (Signed)

## 2022-11-28 NOTE — Op Note (Signed)
PATIENT:  Ethan Morton  PRE-OPERATIVE DIAGNOSIS:  Organic erectile dysfunction  POST-OPERATIVE DIAGNOSIS:  Same  PROCEDURE:  Procedure(s): 3 piece inflatable penile prosthesis (BS/AMS)  SURGEON:  Donald Pore MD  ASST: Tommie Sams, MD  INDICATION: He has had long-standing organic erectile dysfunction and refractory to other modes of treatment. He has elected to proceed with prosthesis implantation.  ANESTHESIA:  General  EBL:  Minimal  Device: 3 piece AMS CX 700: 100 cc conceal reservoir, 24 cm cylinders and 1.5 cm rear-tip extenders on right and left sides  LOCAL MEDICATIONS USED:  None  SPECIMEN: None  DISPOSITION OF SPECIMEN:  N/A  Description of procedure: The patient was taken to the major operating room, placed on the table and administered general anesthesia in the supine position. His genitalia was then prepped with chlorhexidine x 2. He was draped in the usual sterile fashion, and I used [place] on the field. An official timeout was then performed.  A 14 French coude catheter was then placed in the bladder and the bladder was drained and the catheter was plugged. A midline penoscrotal incision was then made and the dissection was carried down to the corpora and urethra. The lonestar retractor was positioned so as to have excellent exposure. 2-0 Vicryl sutures were then placed proximally in each corpus cavernosum to serve as stay sutures. An incision was then made in the corpus cavernosum first on the left-hand side with the bovie. [place] were used to gently dilate the opening. I then dilated the corpus cavernosum with the a 11 Fr brooks dilator distally and proximally. Field goal post tests were performed and there was no evidence of perforations or crossover. I then irrigated the corpus cavernosum with antibiotic solution and measured the distance proximally and distally from the stay suture and was found to be 11 and 14.5 cm, respectively.I then turned my attention to the right  corpus cavernosum and placed my stay sutures, made my corporotomy and dilated the corpus cavernosum in an identical fashion. This was measured and also was found to be 10.5 cm proximally and 15 distally. It was irrigated with anastomotic solution as was the scrotum. I then chose an 24 cm cylinder set with 1.5 cm rear-tip extenders and these were prepped while I prepared the site for reservoir placement.  I then digitally probed into the Left external inguinal ring. My finger was used to poke through the posterior wall of the ring. I used my finger to ensure I was in the appropriate space, and to clear room for the reservoir. I irrigated the space with anastomotic solution and then placed the reservoir in this location. I then filled the reservoir with 99 cc of sterile saline, and checked to confirm proper position. There was minimal backpressure with the reservoir max-filled.  Attention was redirected to the corporotomies where the cylinders were then placed by first fixing the suture to the distal aspect of the right cylinder to a straight needle. This was then loaded on the [facility] inserter and passed through the corporotomy and distally. I then advanced the straight needle with the [place] inserter out through the glans and this was grasped with a hemostat and pulled through the glans and the suture was secured with a hemostat. I then performed an identical maneuver on the contralateral side. After this was performed I irrigated both corpus cavernosum; there was no evidence of urethral perforation. I inserted the distal portion of the cylinder through the corporotomies and pulled this to the end of  the corpora with the suture. The proximal aspect with the rear-tip extender was then passed through the corporotomy and into the seated position on each side. I then connected reservoir tubing to a syringe filled with sterile saline and inflated the device. I noted a good straight erection with both cylinders  equidistant under the glans and no buckling of the cylinders. I therefore deflated the device and closed the corporotomies with used my previously placed stay sutures.   I then grasped the scrotal skin in the midline with a babcock, and used a hemostat to dissect down to the dependent-most portion of the scrotum. The nasal speculum was inserted into this space, and facilitated placement of the pump. The cylinder was then connected to the pump after excising the excess tubing with appropriate shodded hemostats in place and then I used the supplied connectors to make the connection. I then again cycled the device with the pump and it cycled properly. I deflated the device and pumped it up about three quarters of the way to aid with hemostasis. I irrigated the wound one last time with antibiotic irrigation and then closed the deep scrotal tissue over the tubing and pump with running 3-0 monocryl suture. I placed a 10 Fr blake drain over the corporotomies. A second layer was then closed over this first layer with running 3- 0 monocryl, and running skin suture w/ 4-0 monocryl performed. Incision dressed with dermabond.  A mummy wrap was applied. The catheter was connected to closed system drainage, and drain connected to suction bulb and the patient was awakened and taken recovery room in stable and satisfactory condition. He tolerated the procedure well and there were no intraoperative complications. Needle sponge and instrument counts were correct at the end of the operation.

## 2022-11-28 NOTE — Discharge Instructions (Signed)
Penile prosthesis postoperative instructions  Wound:  In most cases your incision will have absorbable sutures that will dissolve within the first 10-20 days. Some will fall out even earlier. Expect some redness as the sutures dissolved but this should occur only around the sutures. If there is generalized redness, especially with increasing pain or swelling, let us know. The scrotum and penis will very likely get "black and blue" as the blood in the tissues spread. Sometimes the whole scrotum will turn colors. The black and blue is followed by a yellow and brown color. In time, all the discoloration will go away. In some cases some firm swelling in the area of the testicle and pump may persist for up to 4-6 weeks after the surgery and is considered normal in most cases.  Drain:   You may be discharged home with a drain in place. If so, you will be taught how to empty it and should keep track of the output. Additionally, you should call the office to arrange for an appointment to have it removed after a few days.   Diet:  You may return to your normal diet within 24 hours following your surgery. You may note some mild nausea and possibly vomiting the first 6-8 hours following surgery. This is usually due to the side effects of anesthesia, and will disappear quite soon. I would suggest clear liquids and a very light meal the first evening following your surgery.  Activity:  Your physical activity should be restricted the first 48 hours. During that time you should remain relatively inactive, moving about only when necessary. During the first 3 weeks following surgery you should avoid lifting any heavy objects (anything greater than 15 pounds), and avoid strenuous exercise. If you work, ask us specifically about your restrictions, both for work and home. We will write a note to your employer if needed.  Avoid using your penis until your follow up visit with Dr Lafreda Casebeer, which will typically be around  3-4 weeks following the surgery. Most people are able to start cycling their device after that appointment, and can have intercourse soon thereafter.   You should plan to wear a tight pair of jockey shorts or an athletic supporter for the first 4-5 days, even to sleep. This will keep the scrotum immobilized to some degree and keep the swelling down.The position of your penis will determine what is most comfortable but I strongly urge you to keep the penis in the "up" position (toward your head). You should continue to tuck "up" your penis when possible for the first 3 months following surgery.  Ice packs should be placed on and off over the scrotum for the first 48 hours. Frozen peas or corn in a ZipLock bag can be frozen, used and re-frozen. Fifteen minutes on and 15 minutes off is a reasonable schedule. The ice is a good pain reliever and keeps the swelling down.  Hygiene:  You may shower 48 hours after your surgery. Tub bathing should be restricted until the wound is completely healed, typically around 2-3 weeks.  Medication:  You will be sent home with some type of pain medication. In many cases you will be sent home with a strong anti-inflammatory medication (Celebrex, Meloxicam) and a narcotic pain pill (hydrocodone or oxycodone). You can also supplement these medications with tylenol (acetaminophen). If the pain medication you are sent home with does not control the pain, please notify the office Problems you should report to us:  Fever of 101.0 degrees   Fahrenheit or greater. Moderate or severe swelling under the skin incision or involving the scrotum. Drug reaction such as hives, a rash, nausea or vomiting.  

## 2022-11-28 NOTE — Anesthesia Procedure Notes (Signed)
Procedure Name: LMA Insertion Date/Time: 11/28/2022 11:33 AM  Performed by: Randye Lobo, CRNAPre-anesthesia Checklist: Patient identified, Emergency Drugs available, Suction available and Patient being monitored Patient Re-evaluated:Patient Re-evaluated prior to induction Oxygen Delivery Method: Circle System Utilized Preoxygenation: Pre-oxygenation with 100% oxygen Induction Type: IV induction Ventilation: Mask ventilation without difficulty LMA: LMA inserted LMA Size: 4.0 Number of attempts: 1 Airway Equipment and Method: Bite block Placement Confirmation: positive ETCO2 Tube secured with: Tape Dental Injury: Teeth and Oropharynx as per pre-operative assessment

## 2022-11-28 NOTE — Progress Notes (Signed)
Pharmacy consulted to dose gentamicin for surgical prophylaxis.   No Known Allergies  Gentamicin 440 mg (5 mg/kg) IV once pre-op ordered. Pharmacy to sign off.   Lenis Noon, PharmD 11/28/22 11:22 AM

## 2022-11-28 NOTE — Anesthesia Preprocedure Evaluation (Signed)
Anesthesia Evaluation  Patient identified by MRN, date of birth, ID band Patient awake    Reviewed: Allergy & Precautions, H&P , NPO status , Patient's Chart, lab work & pertinent test results  Airway Mallampati: II   Neck ROM: full    Dental   Pulmonary neg pulmonary ROS   breath sounds clear to auscultation       Cardiovascular hypertension,  Rhythm:regular Rate:Normal     Neuro/Psych  Neuromuscular disease    GI/Hepatic   Endo/Other    Renal/GU Cr 1.6   H/o prostate CA    Musculoskeletal   Abdominal   Peds  Hematology   Anesthesia Other Findings   Reproductive/Obstetrics                             Anesthesia Physical Anesthesia Plan  ASA: 2  Anesthesia Plan: General   Post-op Pain Management:    Induction: Intravenous  PONV Risk Score and Plan: 2 and Ondansetron, Dexamethasone, Midazolam and Treatment may vary due to age or medical condition  Airway Management Planned: LMA  Additional Equipment:   Intra-op Plan:   Post-operative Plan: Extubation in OR  Informed Consent: I have reviewed the patients History and Physical, chart, labs and discussed the procedure including the risks, benefits and alternatives for the proposed anesthesia with the patient or authorized representative who has indicated his/her understanding and acceptance.     Dental advisory given  Plan Discussed with: CRNA, Anesthesiologist and Surgeon  Anesthesia Plan Comments:        Anesthesia Quick Evaluation

## 2022-11-29 ENCOUNTER — Encounter (HOSPITAL_COMMUNITY): Payer: Self-pay | Admitting: Urology

## 2022-11-29 DIAGNOSIS — N528 Other male erectile dysfunction: Secondary | ICD-10-CM | POA: Diagnosis not present

## 2022-11-29 NOTE — Progress Notes (Signed)
IV removed, drain care and d/c instructions reviewed with patient. All questions answered. Pt discharged with all patient belongings in stable condition.

## 2022-11-29 NOTE — Anesthesia Postprocedure Evaluation (Signed)
Anesthesia Post Note  Patient: Ethan Morton  Procedure(s) Performed: INSERTION OF INFLATABLE PENILE PROTHESIS     Patient location during evaluation: PACU Anesthesia Type: General Level of consciousness: awake and alert Pain management: pain level controlled Vital Signs Assessment: post-procedure vital signs reviewed and stable Respiratory status: spontaneous breathing, nonlabored ventilation, respiratory function stable and patient connected to nasal cannula oxygen Cardiovascular status: blood pressure returned to baseline and stable Postop Assessment: no apparent nausea or vomiting Anesthetic complications: no   No notable events documented.  Last Vitals:  Vitals:   11/29/22 0529 11/29/22 0818  BP: (!) 141/81 (!) 143/76  Pulse: (!) 55 68  Resp: 16 16  Temp: 36.9 C 36.8 C  SpO2: 99% 99%    Last Pain:  Vitals:   11/29/22 0818  TempSrc: Oral  PainSc:                  Storden S

## 2022-11-29 NOTE — Discharge Summary (Signed)
[facility] Discharge Summary  Admit date: 11/28/2022  Discharge date and time: 11/29/22   Discharge to: Home  Discharge Service: Urology  Discharge Attending Physician:  Dr. Terrilee Files, MD  Discharge  Diagnoses: S/P insertion of penile implant  Secondary Diagnosis: Principal Problem:   S/P insertion of penile implant   OR Procedures: Procedure(s): INSERTION OF INFLATABLE PENILE PROTHESIS 11/28/2022   Ancillary Procedures: None   Discharge Day Services: The patient was seen and examined by the Urology team both in the morning and immediately prior to discharge.  Vital signs and laboratory values were stable and within normal limits.  The physical exam was benign and unchanged and all surgical wounds were examined.  Discharge instructions were explained and all questions answered.  Subjective  No acute events overnight. Pain Controlled. No fever or chills.  Objective Patient Vitals for the past 8 hrs:  BP Temp Temp src Pulse Resp SpO2 Weight  11/29/22 0818 (!) 143/76 98.3 F (36.8 C) Oral 68 16 99 % --  11/29/22 0529 (!) 141/81 98.4 F (36.9 C) Oral (!) 55 16 99 % 90.4 kg   Total I/O In: 240 [P.O.:240] Out: -   General Appearance:        No acute distress Lungs:                       Normal work of breathing on room air Heart:                                Regular rate and rhythm Abdomen:                         Soft, non-tender, non-distended Extremities:                      Warm and well perfused GU:          Penoscrotal incision c/d/I covered with dermabond. Pump in scrotum, cylinders in appropriate positioning within phallus. JP drain with serosanguinous output   Hospital Course:  The patient underwent insertion of inflatable penile prosthesis on 11/28/2022.  The patient tolerated the procedure well, was extubated in the OR, and afterwards was taken to the PACU for routine post-surgical care. When stable the patient was transferred to the floor.   The  patient did well postoperatively.  The patient's diet was slowly advanced and at the time of discharge was tolerating a regular diet.  The patient was discharged home 1 Day Post-Op, at which point was tolerating a regular solid diet, was able to void spontaneously, have adequate pain control with P.O. pain medication, and could ambulate without difficulty. The patient will follow up with [place] for post op check and drain removal.   Condition at Discharge: Improved  Discharge Medications:  Allergies as of 11/29/2022   No Known Allergies      Medication List     STOP taking these medications    tadalafil 20 MG tablet Commonly known as: CIALIS       TAKE these medications    acetaminophen 500 MG tablet Commonly known as: TYLENOL Take 2 tablets (1,000 mg total) by mouth every 6 (six) hours.   amLODipine 10 MG tablet Commonly known as: NORVASC Take 10 mg by mouth daily.   celecoxib 200 MG capsule Commonly known as: CELEBREX Take 1 capsule (200 mg total) by mouth 2 (two)  times daily.   hydrochlorothiazide 25 MG tablet Commonly known as: HYDRODIURIL Take 25 mg by mouth daily.   oxyCODONE 5 MG immediate release tablet Commonly known as: Oxy IR/ROXICODONE Take 1 tablet (5 mg total) by mouth every 4 (four) hours as needed for severe pain.   sulfamethoxazole-trimethoprim 800-160 MG tablet Commonly known as: BACTRIM DS Take 1 tablet by mouth 2 (two) times daily.

## 2023-02-25 IMAGING — CT NM PET TUM IMG SKULL BASE T - THIGH
1 of 7 series · 1 of 25 positions shown · non-contrast
Comparison: None.

CLINICAL DATA: 65-year-old male with prostate carcinoma. Biopsy
08/29/2021. PSA 10.1. prostate biopsy field Gleason 3+4=7
adenocarcinoma in the RIGHT mid gland, LEFT base, and LEFT apex.

EXAM:
NUCLEAR MEDICINE PET SKULL BASE TO THIGH
TECHNIQUE: 10.1 mCi F18 Piflufolastat (Pylarify) was injected intravenously.
Full-ring PET imaging was performed from the skull base to thigh
after the radiotracer. CT data was obtained and used for attenuation
correction and anatomic localization.

[Series 3: pet sk_thigh ac · axial · 5.0mm · 4.07mm/px · 1 of 256 slices shown]
[im 154/256]
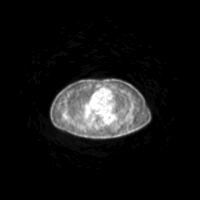

[1 of 25 positions shown; findings below may reference images not displayed]

FINDINGS: NECK

No radiotracer activity in neck lymph nodes.

Incidental CT finding: None

CHEST

No radiotracer accumulation within mediastinal or hilar lymph nodes.
No suspicious pulmonary nodules on the CT scan.

Incidental CT finding: None

ABDOMEN/PELVIS

Prostate: Significant streak artifact and radiotracer attenuation
through the lower pelvis generated by the bilateral hip prosthetics.
No focal activity in the prostate bed.

Lymph nodes: No abnormal radiotracer accumulation within pelvic or
abdominal nodes. There is significant streak artifact and
attenuation of radiotracer activity within the pelvis.

Liver: No evidence of liver metastasis

Incidental CT finding: None

SKELETON

No focal  activity to suggest skeletal metastasis.
IMPRESSION: 1. Significant streak artifact in the pelvis and attenuation of
radiotracer activity related to the bilateral hip prosthetics. With
this caveat, there is no focal activity in the prostate bed. No
suspicious lymph nodes in the pelvis.
2. No periaortic retroperitoneal lymphadenopathy.
3. No visceral metastasis or skeletal metastasis.
